# Patient Record
Sex: Female | Born: 1985 | Race: Black or African American | Hispanic: No | Marital: Married | Smoking: Never smoker
Health system: Southern US, Community
[De-identification: ages and names within clinical notes are randomized; demographics above are authoritative.]

## PROBLEM LIST (undated history)

## (undated) ENCOUNTER — Inpatient Hospital Stay (HOSPITAL_COMMUNITY): Payer: Self-pay

## (undated) DIAGNOSIS — O139 Gestational [pregnancy-induced] hypertension without significant proteinuria, unspecified trimester: Secondary | ICD-10-CM

## (undated) DIAGNOSIS — N979 Female infertility, unspecified: Secondary | ICD-10-CM

## (undated) DIAGNOSIS — R87629 Unspecified abnormal cytological findings in specimens from vagina: Secondary | ICD-10-CM

## (undated) DIAGNOSIS — J302 Other seasonal allergic rhinitis: Secondary | ICD-10-CM

## (undated) DIAGNOSIS — IMO0002 Reserved for concepts with insufficient information to code with codable children: Secondary | ICD-10-CM

## (undated) DIAGNOSIS — Z91030 Bee allergy status: Secondary | ICD-10-CM

## (undated) HISTORY — DX: Other seasonal allergic rhinitis: J30.2

## (undated) HISTORY — PX: NO PAST SURGERIES: SHX2092

## (undated) HISTORY — DX: Gestational (pregnancy-induced) hypertension without significant proteinuria, unspecified trimester: O13.9

## (undated) HISTORY — DX: Bee allergy status: Z91.030

## (undated) HISTORY — DX: Reserved for concepts with insufficient information to code with codable children: IMO0002

---

## 2003-11-21 DIAGNOSIS — IMO0002 Reserved for concepts with insufficient information to code with codable children: Secondary | ICD-10-CM

## 2003-11-21 DIAGNOSIS — R87619 Unspecified abnormal cytological findings in specimens from cervix uteri: Secondary | ICD-10-CM

## 2003-11-21 HISTORY — DX: Unspecified abnormal cytological findings in specimens from cervix uteri: R87.619

## 2003-11-21 HISTORY — DX: Reserved for concepts with insufficient information to code with codable children: IMO0002

## 2012-07-18 ENCOUNTER — Ambulatory Visit (INDEPENDENT_AMBULATORY_CARE_PROVIDER_SITE_OTHER): Payer: 59 | Admitting: Family Medicine

## 2012-07-18 ENCOUNTER — Other Ambulatory Visit (HOSPITAL_COMMUNITY)
Admission: RE | Admit: 2012-07-18 | Discharge: 2012-07-18 | Disposition: A | Discharge status: Home / Self Care | Payer: 59 | Source: Ambulatory Visit | Attending: Family Medicine | Admitting: Family Medicine

## 2012-07-18 ENCOUNTER — Encounter: Payer: Self-pay | Admitting: Family Medicine

## 2012-07-18 VITALS — BP 112/72 | HR 72 | Ht 67.0 in | Wt 171.0 lb

## 2012-07-18 DIAGNOSIS — R5383 Other fatigue: Secondary | ICD-10-CM

## 2012-07-18 DIAGNOSIS — T7840XA Allergy, unspecified, initial encounter: Secondary | ICD-10-CM

## 2012-07-18 DIAGNOSIS — R5381 Other malaise: Secondary | ICD-10-CM

## 2012-07-18 DIAGNOSIS — Z0000 Encounter for general adult medical examination without abnormal findings: Secondary | ICD-10-CM

## 2012-07-18 DIAGNOSIS — Z91030 Bee allergy status: Secondary | ICD-10-CM

## 2012-07-18 DIAGNOSIS — N760 Acute vaginitis: Secondary | ICD-10-CM | POA: Insufficient documentation

## 2012-07-18 DIAGNOSIS — Z113 Encounter for screening for infections with a predominantly sexual mode of transmission: Secondary | ICD-10-CM | POA: Insufficient documentation

## 2012-07-18 DIAGNOSIS — Z01419 Encounter for gynecological examination (general) (routine) without abnormal findings: Secondary | ICD-10-CM | POA: Insufficient documentation

## 2012-07-18 DIAGNOSIS — Z23 Encounter for immunization: Secondary | ICD-10-CM

## 2012-07-18 DIAGNOSIS — Z13220 Encounter for screening for lipoid disorders: Secondary | ICD-10-CM

## 2012-07-18 LAB — POCT URINALYSIS DIPSTICK
Blood, UA: NEGATIVE
Glucose, UA: NEGATIVE
Ketones, UA: NEGATIVE
Spec Grav, UA: 1.015

## 2012-07-18 MED ORDER — EPINEPHRINE 0.3 MG/0.3ML IJ DEVI: 0.3000 mg | Freq: Once | INTRAMUSCULAR | Status: DC

## 2012-07-18 NOTE — Patient Instructions (Addendum)
HEALTH MAINTENANCE RECOMMENDATIONS:  It is recommended that you get at least 30 minutes of aerobic exercise at least 5 days/week (for weight loss, you may need as much as 60-90 minutes). This can be any activity that gets your heart rate up. This can be divided in 10-15 minute intervals if needed, but try and build up your endurance at least once a week.  Weight bearing exercise is also recommended twice weekly.  Eat a healthy diet with lots of vegetables, fruits and fiber.  "Colorful" foods have a lot of vitamins (ie green vegetables, tomatoes, red peppers, etc).  Limit sweet tea, regular sodas and alcoholic beverages, all of which has a lot of calories and sugar.  Up to 1 alcoholic drink daily may be beneficial for women (unless trying to lose weight, watch sugars).  Drink a lot of water.  Calcium recommendations are 1200-1500 mg daily (1500 mg for postmenopausal women or women without ovaries), and vitamin D 1000 IU daily.  This should be obtained from diet and/or supplements (vitamins), and calcium should not be taken all at once, but in divided doses.  Monthly self breast exams and yearly mammograms for women over the age of 71 is recommended.  Sunscreen of at least SPF 30 should be used on all sun-exposed parts of the skin when outside between the hours of 10 am and 4 pm (not just when at beach or pool, but even with exercise, golf, tennis, and yard work!)  Use a sunscreen that says "broad spectrum" so it covers both UVA and UVB rays, and make sure to reapply every 1-2 hours.  Remember to change the batteries in your smoke detectors when changing your clock times in the spring and fall.  Use your seat belt every time you are in a car, and please drive safely and not be distracted with cell phones and texting while driving.  Please start taking prenatal vitamins.  Routine eye exam is recommended; dental visits twice yearly for cleanings.  Please get flu shot through your work [employer] given  today (tetanus and pertussis)

## 2012-07-18 NOTE — Progress Notes (Signed)
Chief Complaint  Patient presents with  . Annual Exam    new patient CPE non fasting. Would like to have pap done today. States she has been having a vaginal "fishy odor" x 2-3 months. Also states she is allergic to bee stings and needs an epi pen.   Health Maintenance: There is no immunization history on file for this patient. Can't recall last tetanus Last Pap smear: >3 years ago.  Had abnormal pap with last pregnancy, s/p cryotherapy postpartum Last mammogram: never Last colonoscopy: never Last DEXA:  never Dentist: once yearly Ophtho: 4-5 years ago, wears glasses Exercise: walks dogs 3x/week Never had cholesterol checked.  Past Medical History  Diagnosis Date  . 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 Seasonal allergies   . PIH (pregnancy induced hypertension)     resolved after pregnancy; didn't require medication  . Abnormal pap 2005    s/p cryotherapy    History reviewed. No pertinent past surgical history.  History   Social History  . Marital Status: Single    Spouse Name: N/A    Number of Children: 1  . Years of Education: N/A   Occupational History  . call center [employer]   Social History Main Topics  . Smoking status: Never Smoker   . Smokeless tobacco: Never Used  . Alcohol Use: Yes     rarely, social gatherings.  . Drug Use: No  . Sexually Active: Yes -- Female partner(s)    Birth Control/ Protection: None   Other Topics Concern  . Not on file   Social History Narrative   Lives with fiance, daughter, 2 dogs.    Family History  Problem Relation Age of Onset  . Gout Mother   . Cancer Maternal Grandmother     breast cancer  . Diabetes Paternal Grandmother   . Hypertension Paternal Grandmother   . Heart disease Neg Hx     No current outpatient prescriptions on file.  Allergies  Allergen Reactions  . Bee Venom Anaphylaxis    ROS:  The patient denies anorexia, fever, weight changes, headaches,  vision changes,  decreased hearing, ear pain, sore throat, breast concerns, chest pain, palpitations, dizziness, syncope, dyspnea on exertion, cough, swelling, nausea, vomiting, diarrhea, constipation, abdominal pain, melena, hematochezia, indigestion/heartburn, hematuria, incontinence, dysuria, irregular menstrual cycles, genital lesions, joint pains, numbness, tingling, weakness, tremor, suspicious skin lesions, depression, anxiety, abnormal bleeding/bruising, or enlarged lymph nodes. +notes worsening odor and amount of discharge after intercourse, creamy yellow.  H/o BV in past.  PHYSICAL EXAM: BP 112/72  Pulse 72  Ht 5\' 7"  (1.702 m)  Wt 171 lb (77.565 kg)  BMI 26.78 kg/m2  LMP 07/03/2012  General Appearance:    Alert, cooperative, no distress, appears stated age  Head:    Normocephalic, without obvious abnormality, atraumatic  Eyes:    PERRL, conjunctiva/corneas clear, EOM's intact, fundi    benign  Ears:    Normal TM's and external ear canals  Nose:   Nares normal, mucosa normal, no drainage or sinus   tenderness  Throat:   Lips, mucosa, and tongue normal; teeth and gums normal  Neck:   Supple, no lymphadenopathy;  thyroid:  no   enlargement/tenderness/nodules; no carotid   bruit or JVD  Back:    Spine nontender, no curvature, ROM normal, no CVA     tenderness  Lungs:     Clear to auscultation bilaterally without wheezes, rales or     ronchi; respirations  unlabored  Chest Wall:    No tenderness or deformity   Heart:    Regular rate and rhythm, S1 and S2 normal, no murmur, rub   or gallop  Breast Exam:    No tenderness, masses, or nipple discharge or inversion.      No axillary lymphadenopathy  Abdomen:     Soft, non-tender, nondistended, normoactive bowel sounds,    no masses, no hepatosplenomegaly  Genitalia:    Normal external genitalia without lesions.  BUS and vagina normal; cervix without lesions, or cervical motion tenderness. Small amount of creamy white discharge, no odor .  Uterus and  adnexa not enlarged, nontender, no masses.  Pap performed  Rectal:    Not performed due to age<40 and no related complaints  Extremities:   No clubbing, cyanosis or edema  Pulses:   2+ and symmetric all extremities  Skin:   Skin color, texture, turgor normal, no rashes or lesions  Lymph nodes:   Cervical, supraclavicular, and axillary nodes normal  Neurologic:   CNII-XII intact, normal strength, sensation and gait; reflexes 2+ and symmetric throughout          Psych:   Normal mood, affect, hygiene and grooming.      ASSESSMENT/PLAN: 1. Routine general medical examination at a health care facility  Visual acuity screening, POCT Urinalysis Dipstick, CBC with Differential, Lipid panel, Comprehensive metabolic panel, Vitamin D 25 hydroxy, TSH, Cytology - PAP  2. Screening for lipoid disorders  Lipid panel  3. Other malaise and fatigue  CBC with Differential, Comprehensive metabolic panel, Vitamin D 25 hydroxy, TSH  4. Need for Tdap vaccination  Tdap vaccine greater than or equal to 7yo IM  5. Yellow jacket sting allergy  EPINEPHrine (EPI-PEN) 0.3 mg/0.3 mL DEVI  6. Vaginitis  Cytology - PAP   BV testing with pap, as well as GC, chlamydia  Tdap given today.  Flu shot is recommended Start prenatal vitamins.  Discussed monthly self breast exams and yearly mammograms after the age of 35; at least 30 minutes of aerobic activity at least 5 days/week; proper sunscreen use reviewed; healthy diet, including goals of calcium and vitamin D intake and alcohol recommendations (less than or equal to 1 drink/day) reviewed; regular seatbelt use; changing batteries in smoke detectors.

## 2012-07-24 ENCOUNTER — Other Ambulatory Visit: Payer: Self-pay | Admitting: *Deleted

## 2012-07-25 MED ORDER — METRONIDAZOLE 500 MG PO TABS: ORAL_TABLET | ORAL | Status: DC

## 2012-07-25 NOTE — Addendum Note (Signed)
Addended by: Barbette Or A on: 07/25/2012 09:03 AM   Modules accepted: Orders

## 2012-07-26 ENCOUNTER — Other Ambulatory Visit: Payer: 59

## 2012-07-26 DIAGNOSIS — Z13220 Encounter for screening for lipoid disorders: Secondary | ICD-10-CM

## 2012-07-26 DIAGNOSIS — R5381 Other malaise: Secondary | ICD-10-CM

## 2012-07-26 DIAGNOSIS — Z0000 Encounter for general adult medical examination without abnormal findings: Secondary | ICD-10-CM

## 2012-07-26 LAB — COMPREHENSIVE METABOLIC PANEL
ALT: 11 U/L (ref 0–35)
CO2: 25 mEq/L (ref 19–32)
Calcium: 9.6 mg/dL (ref 8.4–10.5)
Chloride: 106 mEq/L (ref 96–112)
Creat: 0.95 mg/dL (ref 0.50–1.10)
Glucose, Bld: 93 mg/dL (ref 70–99)
Sodium: 138 mEq/L (ref 135–145)
Total Protein: 6.8 g/dL (ref 6.0–8.3)

## 2012-07-26 LAB — CBC WITH DIFFERENTIAL/PLATELET
Basophils Absolute: 0 10*3/uL (ref 0.0–0.1)
Basophils Relative: 1 % (ref 0–1)
HCT: 36.4 % (ref 36.0–46.0)
Lymphocytes Relative: 42 % (ref 12–46)
MCHC: 35.4 g/dL (ref 30.0–36.0)
Monocytes Absolute: 0.3 10*3/uL (ref 0.1–1.0)
Neutro Abs: 2.5 10*3/uL (ref 1.7–7.7)
Platelets: 369 10*3/uL (ref 150–400)
RDW: 12.9 % (ref 11.5–15.5)
WBC: 4.9 10*3/uL (ref 4.0–10.5)

## 2012-07-26 LAB — LIPID PANEL
LDL Cholesterol: 147 mg/dL — ABNORMAL HIGH (ref 0–99)
Triglycerides: 63 mg/dL (ref <150)

## 2012-07-26 LAB — TSH: TSH: 0.785 u[IU]/mL (ref 0.350–4.500)

## 2012-07-27 LAB — VITAMIN D 25 HYDROXY (VIT D DEFICIENCY, FRACTURES): Vit D, 25-Hydroxy: 19 ng/mL — ABNORMAL LOW (ref 30–89)

## 2012-07-29 ENCOUNTER — Other Ambulatory Visit: Payer: Self-pay | Admitting: *Deleted

## 2012-07-29 DIAGNOSIS — E559 Vitamin D deficiency, unspecified: Secondary | ICD-10-CM

## 2012-07-29 MED ORDER — ERGOCALCIFEROL 1.25 MG (50000 UT) PO CAPS: 50000.0000 [IU] | ORAL_CAPSULE | ORAL | Status: DC

## 2015-01-13 ENCOUNTER — Encounter: Payer: Self-pay | Admitting: Family Medicine

## 2015-01-13 ENCOUNTER — Other Ambulatory Visit (HOSPITAL_COMMUNITY)
Admission: RE | Admit: 2015-01-13 | Discharge: 2015-01-13 | Disposition: A | Discharge status: Home / Self Care | Payer: BLUE CROSS/BLUE SHIELD | Source: Ambulatory Visit | Attending: Family Medicine | Admitting: Family Medicine

## 2015-01-13 ENCOUNTER — Ambulatory Visit (INDEPENDENT_AMBULATORY_CARE_PROVIDER_SITE_OTHER): Payer: BLUE CROSS/BLUE SHIELD | Admitting: Family Medicine

## 2015-01-13 VITALS — BP 122/74 | HR 80 | Ht 67.0 in | Wt 156.0 lb

## 2015-01-13 DIAGNOSIS — Z113 Encounter for screening for infections with a predominantly sexual mode of transmission: Secondary | ICD-10-CM | POA: Insufficient documentation

## 2015-01-13 DIAGNOSIS — E7800 Pure hypercholesterolemia, unspecified: Secondary | ICD-10-CM | POA: Insufficient documentation

## 2015-01-13 DIAGNOSIS — N979 Female infertility, unspecified: Secondary | ICD-10-CM

## 2015-01-13 DIAGNOSIS — Z0000 Encounter for general adult medical examination without abnormal findings: Secondary | ICD-10-CM

## 2015-01-13 DIAGNOSIS — Z1151 Encounter for screening for human papillomavirus (HPV): Secondary | ICD-10-CM | POA: Diagnosis present

## 2015-01-13 DIAGNOSIS — Z01419 Encounter for gynecological examination (general) (routine) without abnormal findings: Secondary | ICD-10-CM | POA: Insufficient documentation

## 2015-01-13 DIAGNOSIS — G4700 Insomnia, unspecified: Secondary | ICD-10-CM

## 2015-01-13 DIAGNOSIS — E559 Vitamin D deficiency, unspecified: Secondary | ICD-10-CM

## 2015-01-13 NOTE — Patient Instructions (Signed)
HEALTH MAINTENANCE RECOMMENDATIONS:  It is recommended that you get at least 30 minutes of aerobic exercise at least 5 days/week (for weight loss, you may need as much as 60-90 minutes). This can be any activity that gets your heart rate up. This can be divided in 10-15 minute intervals if needed, but try and build up your endurance at least once a week.  Weight bearing exercise is also recommended twice weekly.  Eat a healthy diet with lots of vegetables, fruits and fiber.  "Colorful" foods have a lot of vitamins (ie green vegetables, tomatoes, red peppers, etc).  Limit sweet tea, regular sodas and alcoholic beverages, all of which has a lot of calories and sugar.  Up to 1 alcoholic drink daily may be beneficial for women (unless trying to lose weight, watch sugars).  Drink a lot of water.  Calcium recommendations are 1200-1500 mg daily (1500 mg for postmenopausal women or women without ovaries), and vitamin D 1000 IU daily.  This should be obtained from diet and/or supplements (vitamins), and calcium should not be taken all at once, but in divided doses.  Monthly self breast exams and yearly mammograms for women over the age of 70 is recommended.  Sunscreen of at least SPF 30 should be used on all sun-exposed parts of the skin when outside between the hours of 10 am and 4 pm (not just when at beach or pool, but even with exercise, golf, tennis, and yard work!)  Use a sunscreen that says "broad spectrum" so it covers both UVA and UVB rays, and make sure to reapply every 1-2 hours.  Remember to change the batteries in your smoke detectors when changing your clock times in the spring and fall.  Use your seat belt every time you are in a car, and please drive safely and not be distracted with cell phones and texting while driving.  Melatonin is an herbal supplement that might help you fall asleep and is safe to use nightly. You can also consider "PM" medications which usually contain diphenhydramine  (benadryl).   Insomnia Insomnia is frequent trouble falling and/or staying asleep. Insomnia can be a long term problem or a short term problem. Both are common. Insomnia can be a short term problem when the wakefulness is related to a certain stress or worry. Long term insomnia is often related to ongoing stress during waking hours and/or poor sleeping habits. Overtime, sleep deprivation itself can make the problem worse. Every little thing feels more severe because you are overtired and your ability to cope is decreased. CAUSES   Stress, anxiety, and depression.  Poor sleeping habits.  Distractions such as TV in the bedroom.  Naps close to bedtime.  Engaging in emotionally charged conversations before bed.  Technical reading before sleep.  Alcohol and other sedatives. They may make the problem worse. They can hurt normal sleep patterns and normal dream activity.  Stimulants such as caffeine for several hours prior to bedtime.  Pain syndromes and shortness of breath can cause insomnia.  Exercise late at night.  Changing time zones may cause sleeping problems (jet lag). It is sometimes helpful to have someone observe your sleeping patterns. They should look for periods of not breathing during the night (sleep apnea). They should also look to see how long those periods last. If you live alone or observers are uncertain, you can also be observed at a sleep clinic where your sleep patterns will be professionally monitored. Sleep apnea requires a checkup and treatment. Give  your caregivers your medical history. Give your caregivers observations your family has made about your sleep.  SYMPTOMS   Not feeling rested in the morning.  Anxiety and restlessness at bedtime.  Difficulty falling and staying asleep. TREATMENT   Your caregiver may prescribe treatment for an underlying medical disorders. Your caregiver can give advice or help if you are using alcohol or other drugs for  self-medication. Treatment of underlying problems will usually eliminate insomnia problems.  Medications can be prescribed for short time use. They are generally not recommended for lengthy use.  Over-the-counter sleep medicines are not recommended for lengthy use. They can be habit forming.  You can promote easier sleeping by making lifestyle changes such as:  Using relaxation techniques that help with breathing and reduce muscle tension.  Exercising earlier in the day.  Changing your diet and the time of your last meal. No night time snacks.  Establish a regular time to go to bed.  Counseling can help with stressful problems and worry.  Soothing music and white noise may be helpful if there are background noises you cannot remove.  Stop tedious detailed work at least one hour before bedtime. HOME CARE INSTRUCTIONS   Keep a diary. Inform your caregiver about your progress. This includes any medication side effects. See your caregiver regularly. Take note of:  Times when you are asleep.  Times when you are awake during the night.  The quality of your sleep.  How you feel the next day. This information will help your caregiver care for you.  Get out of bed if you are still awake after 15 minutes. Read or do some quiet activity. Keep the lights down. Wait until you feel sleepy and go back to bed.  Keep regular sleeping and waking hours. Avoid naps.  Exercise regularly.  Avoid distractions at bedtime. Distractions include watching television or engaging in any intense or detailed activity like attempting to balance the household checkbook.  Develop a bedtime ritual. Keep a familiar routine of bathing, brushing your teeth, climbing into bed at the same time each night, listening to soothing music. Routines increase the success of falling to sleep faster.  Use relaxation techniques. This can be using breathing and muscle tension release routines. It can also include visualizing  peaceful scenes. You can also help control troubling or intruding thoughts by keeping your mind occupied with boring or repetitive thoughts like the old concept of counting sheep. You can make it more creative like imagining planting one beautiful flower after another in your backyard garden.  During your day, work to eliminate stress. When this is not possible use some of the previous suggestions to help reduce the anxiety that accompanies stressful situations. MAKE SURE YOU:   Understand these instructions.  Will watch your condition.  Will get help right away if you are not doing well or get worse. Document Released: 11/03/2000 Document Revised: 01/29/2012 Document Reviewed: 12/04/2007 [facility] HospitalExit[facility] Patient Information 2015 [place], [place]. This information is not intended to replace advice given to you by your health care provider. Make sure you discuss any questions you have with your health care provider.

## 2015-01-13 NOTE — Progress Notes (Signed)
Chief Complaint  Patient presents with  . Annual Exam    nonfasting annual exam with pap. Did not do eye exam as she had exam last week with Dr.McFarland. Is having some sleep issues over the last year-had worsened over the last 6 months.    Jennifer Dawson is a 29 y.o. female who presents for a complete physical.  She has the following concerns:  Her husband switched to 3rd shift (working 7p - 7a 4x/week) about a year ago.  She thinks this triggered her having problems sleeping.  She likes to have him next to her when sleeping--she will take a nap with him after he comes home from work, and other times she will stay up with him, so they can spend time together.  She has trouble relaxing her brain in the evening to go to sleep, whether he is home or not (originally it was just when she was home alone).  She and her husband have been trying for pregnancy for over 2 years (maybe 3).  She has had 1 pregnancy, different father.  He has never fathered a child.  She has regular periods, and uses an app on her phone to track it.  She plans to schedule with fertility specialist, and have her husband and herself tested.  Lost 7 pounds in the last week and a half; has been taking OTC supplement (see under medication section) and working out twice daily.  Immunization History  Administered Date(s) Administered  . Tdap 07/18/2012  she did not get a flu shot this year and doesn't want one Last Pap smear: 06/2012; Had abnormal pap with her pregnancy, s/p cryotherapy postpartum Last mammogram: never Last colonoscopy: never Last DEXA: never Dentist: twice yearly Ophtho: last week Exercise: works out 6 days/week (high intensity workouts, weights 3x/week and cardio daily). walks dogs 3x/week Lipids checked at last physical: Lab Results  Component Value Date   CHOL 205* 07/26/2012   HDL 45 07/26/2012   LDLCALC 147* 07/26/2012   TRIG 63 07/26/2012   CHOLHDL 4.6 07/26/2012    Past Medical History   Diagnosis Date  . 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 Seasonal allergies   . PIH (pregnancy induced hypertension)     resolved after pregnancy; didn't require medication  . Abnormal pap 2005    s/p cryotherapy    History reviewed. No pertinent past surgical history.  History   Social History  . Marital Status: Single    Spouse Name: N/A  . Number of Children: 1  . Years of Education: N/A   Occupational History  . Not on file.   Social History Main Topics  . Smoking status: Never Smoker   . Smokeless tobacco: Never Used  . Alcohol Use: Yes     Comment: rarely, social gatherings.  . Drug Use: No  . Sexual Activity:    Partners: Male    Birth Control/ Protection: None   Other Topics Concern  . Not on file   Social History Narrative   Lives with husband, daughter, 2 dogs.   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      Family History  Problem Relation Age of Onset  . Gout Mother   . Cancer Maternal Grandmother     breast cancer  . Breast cancer Maternal Grandmother     possibly in her 49's  . Diabetes Paternal Grandmother   . Hypertension Paternal Grandmother   . Heart  disease Neg Hx   . Hypertension Father     Outpatient Encounter Prescriptions as of 01/13/2015  Medication Sig  . EPINEPHrine (EPI-PEN) 0.3 mg/0.3 mL DEVI Inject 0.3 mLs (0.3 mg total) into the muscle once. (Patient not taking: Reported on 01/13/2015)  . [DISCONTINUED] ergocalciferol (VITAMIN D2) 50000 UNITS capsule Take 1 capsule (50,000 Units total) by mouth once a week.  . [DISCONTINUED] metroNIDAZOLE (FLAGYL) 500 MG tablet Take 1 tablet BID for 7 days.  She stopped taking her multivitamin. She has been taking Shredz (a weight loss supplement)--taking fatburner, toner and detox.  Allergies  Allergen Reactions  . Bee Venom Anaphylaxis   ROS: The patient denies anorexia, fever, headaches, vision changes, decreased hearing, ear pain, sore  throat, breast concerns, chest pain, palpitations, dizziness, syncope, dyspnea on exertion, cough, swelling, nausea, vomiting, diarrhea, constipation, abdominal pain, melena, hematochezia, indigestion/heartburn, hematuria, incontinence, dysuria, irregular menstrual cycles, vaginal discharge, odor, itch, genital lesions, joint pains, numbness, tingling, weakness, tremor, suspicious skin lesions, depression, anxiety, abnormal bleeding/bruising, or enlarged lymph nodes. She lost 15 pounds since her last physical 06/2012. Breast itchiness recently.  PHYSICAL EXAM:  BP 122/74 mmHg  Pulse 80  Ht _0  (1.702 m)  Wt 156 lb (70.761 kg)  BMI 24.43 kg/m2  LMP 12/28/2014  General Appearance:   Alert, cooperative, no distress, appears stated age  Head:   Normocephalic, without obvious abnormality, atraumatic  Eyes:   PERRL, conjunctiva/corneas clear, EOM's intact, fundi   benign  Ears:   Normal TM's and external ear canals  Nose:  Nares normal, mucosa normal, no drainage or sinus tenderness  Throat:  Lips, mucosa, and tongue normal; teeth and gums normal  Neck:  Supple, no lymphadenopathy; thyroid: no enlargement/tenderness/nodules; no carotid  bruit or JVD  Back:  Spine nontender, no curvature, ROM normal, no CVA tenderness  Lungs:   Clear to auscultation bilaterally without wheezes, rales or ronchi; respirations unlabored  Chest Wall:   No tenderness or deformity  Heart:   Regular rate and rhythm, S1 and S2 normal, no murmur, rub  or gallop  Breast Exam:   No tenderness, masses, or nipple discharge or inversion. No axillary lymphadenopathy. Skin appears normal.  Abdomen:   Soft, non-tender, nondistended, normoactive bowel sounds,   no masses, no hepatosplenomegaly  Genitalia:   Normal external genitalia without lesions. BUS and vagina normal; cervix without lesions, or cervical motion tenderness. No abnormal vaginal discharge.  Uterus and adnexa not enlarged, nontender, no masses. Pap performed  Rectal:   Not performed due to age<40 and no related complaints  Extremities:  No clubbing, cyanosis or edema  Pulses:  2+ and symmetric all extremities  Skin:  Skin color, texture, turgor normal, no rashes or lesions  Lymph nodes:  Cervical, supraclavicular, and axillary nodes normal  Neurologic:  CNII-XII intact, normal strength, sensation and gait; reflexes 2+ and symmetric throughout   Psych: Normal mood, affect, hygiene and grooming.        ASSESSMENT/PLAN:  Annual physical exam - Plan: CBC with Differential/Platelet, Lipid panel, Vit D  25 hydroxy (rtn osteoporosis monitoring), TSH, Comprehensive metabolic panel, HIV antibody, Cytology - PAP [facility]  Vitamin D deficiency - noncompliant with supplementation recently - Plan: Vit D  25 hydroxy (rtn osteoporosis monitoring)  Pure hypercholesterolemia - return for fasting labs - Plan: Lipid panel  Insomnia - counseled re: sleep hygiene, OTC meds  Infertility, female - agree with eval.  discussed BBT and ovulation predictor kit. Husband needs testing. restart PNV   Counseled  re: sleep hygiene and OTC meds, relaxation techniques Stop naps. Restart PNV; ensure getting at least 1000 IU of Vit D (through PNV and/or supplement)  Discussed monthly self breast exams and yearly mammograms after the age of 35; at least 30 minutes of aerobic activity at least 5 days/week; proper sunscreen use reviewed; healthy diet, including goals of calcium and vitamin D intake and alcohol recommendations (less than or equal to 1 drink/day) reviewed; regular seatbelt use; changing batteries in smoke detectors   Return for fasting labs HIV, lipid, CBC, TSH, c-met, vit D  F/u 1 year, sooner prn

## 2015-01-15 ENCOUNTER — Encounter: Payer: Self-pay | Admitting: Family Medicine

## 2015-01-15 LAB — CYTOLOGY - PAP

## 2015-01-20 ENCOUNTER — Other Ambulatory Visit: Payer: Self-pay | Admitting: *Deleted

## 2015-01-20 ENCOUNTER — Other Ambulatory Visit: Payer: BLUE CROSS/BLUE SHIELD

## 2015-01-20 DIAGNOSIS — Z0000 Encounter for general adult medical examination without abnormal findings: Secondary | ICD-10-CM

## 2015-01-20 DIAGNOSIS — E7800 Pure hypercholesterolemia, unspecified: Secondary | ICD-10-CM

## 2015-01-20 DIAGNOSIS — E559 Vitamin D deficiency, unspecified: Secondary | ICD-10-CM

## 2015-01-20 DIAGNOSIS — Z91030 Bee allergy status: Secondary | ICD-10-CM

## 2015-01-20 LAB — CBC WITH DIFFERENTIAL/PLATELET
BASOS ABS: 0 10*3/uL (ref 0.0–0.1)
Basophils Relative: 0 % (ref 0–1)
Eosinophils Absolute: 0 10*3/uL (ref 0.0–0.7)
Eosinophils Relative: 1 % (ref 0–5)
HCT: 38 % (ref 36.0–46.0)
HEMOGLOBIN: 12.4 g/dL (ref 12.0–15.0)
LYMPHS ABS: 2.6 10*3/uL (ref 0.7–4.0)
Lymphocytes Relative: 56 % — ABNORMAL HIGH (ref 12–46)
MCH: 29.8 pg (ref 26.0–34.0)
MCHC: 32.6 g/dL (ref 30.0–36.0)
MCV: 91.3 fL (ref 78.0–100.0)
MPV: 9.3 fL (ref 8.6–12.4)
Monocytes Absolute: 0.3 10*3/uL (ref 0.1–1.0)
Monocytes Relative: 6 % (ref 3–12)
NEUTROS PCT: 37 % — AB (ref 43–77)
Neutro Abs: 1.7 10*3/uL (ref 1.7–7.7)
Platelets: 343 10*3/uL (ref 150–400)
RBC: 4.16 MIL/uL (ref 3.87–5.11)
RDW: 13.4 % (ref 11.5–15.5)
WBC: 4.6 10*3/uL (ref 4.0–10.5)

## 2015-01-20 MED ORDER — EPINEPHRINE 0.3 MG/0.3ML IJ SOAJ: 0.3000 mg | Freq: Once | INTRAMUSCULAR | Status: AC

## 2015-01-21 ENCOUNTER — Other Ambulatory Visit: Payer: Self-pay | Admitting: *Deleted

## 2015-01-21 LAB — COMPREHENSIVE METABOLIC PANEL
ALBUMIN: 3.9 g/dL (ref 3.5–5.2)
ALT: 18 U/L (ref 0–35)
AST: 19 U/L (ref 0–37)
Alkaline Phosphatase: 61 U/L (ref 39–117)
BUN: 11 mg/dL (ref 6–23)
CALCIUM: 9.3 mg/dL (ref 8.4–10.5)
CHLORIDE: 108 meq/L (ref 96–112)
CO2: 22 mEq/L (ref 19–32)
Creat: 0.93 mg/dL (ref 0.50–1.10)
GLUCOSE: 88 mg/dL (ref 70–99)
Potassium: 4.3 mEq/L (ref 3.5–5.3)
Sodium: 140 mEq/L (ref 135–145)
Total Bilirubin: 0.3 mg/dL (ref 0.2–1.2)
Total Protein: 6.5 g/dL (ref 6.0–8.3)

## 2015-01-21 LAB — LIPID PANEL
CHOLESTEROL: 182 mg/dL (ref 0–200)
HDL: 51 mg/dL (ref >=46)
LDL Cholesterol: 119 mg/dL — ABNORMAL HIGH (ref 0–99)
Total CHOL/HDL Ratio: 3.6 Ratio
Triglycerides: 58 mg/dL (ref <150)
VLDL: 12 mg/dL (ref 0–40)

## 2015-01-21 LAB — HIV ANTIBODY (ROUTINE TESTING W REFLEX): HIV: NONREACTIVE

## 2015-01-21 LAB — TSH: TSH: 1.897 u[IU]/mL (ref 0.350–4.500)

## 2015-01-21 LAB — VITAMIN D 25 HYDROXY (VIT D DEFICIENCY, FRACTURES): Vit D, 25-Hydroxy: 22 ng/mL — ABNORMAL LOW (ref 30–100)

## 2015-01-21 MED ORDER — ERGOCALCIFEROL 1.25 MG (50000 UT) PO CAPS: 50000.0000 [IU] | ORAL_CAPSULE | ORAL | Status: DC

## 2015-04-07 ENCOUNTER — Telehealth: Payer: Self-pay | Admitting: Internal Medicine

## 2015-04-07 NOTE — Telephone Encounter (Signed)
Faxed [facility] records to [facility] @ [phone] on 04/06/15

## 2016-01-17 ENCOUNTER — Ambulatory Visit (INDEPENDENT_AMBULATORY_CARE_PROVIDER_SITE_OTHER): Payer: BLUE CROSS/BLUE SHIELD | Admitting: Family Medicine

## 2016-01-17 ENCOUNTER — Encounter: Payer: Self-pay | Admitting: Family Medicine

## 2016-01-17 VITALS — BP 118/80 | HR 72 | Ht 67.25 in | Wt 166.0 lb

## 2016-01-17 DIAGNOSIS — Z0000 Encounter for general adult medical examination without abnormal findings: Secondary | ICD-10-CM

## 2016-01-17 DIAGNOSIS — E559 Vitamin D deficiency, unspecified: Secondary | ICD-10-CM | POA: Diagnosis not present

## 2016-01-17 DIAGNOSIS — Z23 Encounter for immunization: Secondary | ICD-10-CM | POA: Diagnosis not present

## 2016-01-17 DIAGNOSIS — E7800 Pure hypercholesterolemia, unspecified: Secondary | ICD-10-CM | POA: Diagnosis not present

## 2016-01-17 LAB — POCT URINALYSIS DIPSTICK
Bilirubin, UA: NEGATIVE
GLUCOSE UA: NEGATIVE
Ketones, UA: NEGATIVE
Leukocytes, UA: NEGATIVE
NITRITE UA: NEGATIVE
PH UA: 6
PROTEIN UA: NEGATIVE
RBC UA: NEGATIVE
UROBILINOGEN UA: NEGATIVE

## 2016-01-17 LAB — LIPID PANEL
CHOLESTEROL: 215 mg/dL — AB (ref 125–200)
HDL: 55 mg/dL (ref >=46)
LDL Cholesterol: 144 mg/dL — ABNORMAL HIGH (ref <130)
Total CHOL/HDL Ratio: 3.9 Ratio (ref <=5.0)
Triglycerides: 81 mg/dL (ref <150)
VLDL: 16 mg/dL (ref <30)

## 2016-01-17 NOTE — Progress Notes (Signed)
Chief Complaint  Patient presents with  . Annual Exam    fasting annual exam with pap. Started IVF treatments @ [facility]. Did not do eye exam, she is going to call and schedule one with Dr.McFarland.    Jennifer Dawson is a 30 y.o. female who presents for a complete physical.  She has the following concerns:  She and her husband have been trying for pregnancy for over 3 years. She has had 1 pregnancy, different father. He has never fathered a child. She has regular periods. They felt she wasn't ovulating, that his sperm counts were okay. She has been going to [facility] and started ovulation-inducing treatments (started in November).   Vitamin D deficiency:  Treated with 12 weeks of rx last year after it was low again at 22. She had been sporadic with vitamins for much of the year, but has been taking prenatal vitamins daily since November.  Intermittent insomnia.--melatonin helps, can fall asleep within an hour of taking.  Only needs it about 2x/week.  Hyperlipidemia:  Improved last year with dietary changes.  Eating more fruits, vegetables, protein (chicken, fish), a "clean" diet, since going to fertility specialist. Red meat 1x/mo, uses [place] sausage. Uses [employer] for butter, only a little cheese; uses egg whites.  Immunization History  Administered Date(s) Administered  . Tdap 07/18/2012   Last Pap smear: 12/2014, normal, no high risk HPV: Had abnormal pap with her pregnancy, s/p cryotherapy postpartum (2005) Last mammogram: never Last colonoscopy: never Last DEXA: never Dentist: twice yearly Ophtho: yearly Exercise: works out 5 days/week (medium intensity workouts, weights 3x/week and cardio daily). walks dogs 3x/week Lipids: Lab Results  Component Value Date   CHOL 182 01/20/2015   HDL 51 01/20/2015   LDLCALC 119* 01/20/2015   TRIG 58 01/20/2015   CHOLHDL 3.6 01/20/2015  Lipids were improved compared to prior (07/2012 LDL was  147)  01/2015--normal CBC, chem and TSH, HIV negative 01/2015: Vitamin D-OH 22  Past Medical History  Diagnosis Date  . 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 Seasonal allergies   . PIH (pregnancy induced hypertension)     resolved after pregnancy; didn't require medication  . Abnormal pap 2005    s/p cryotherapy    History reviewed. No pertinent past surgical history.  Social History   Social History  . Marital Status: Married    Spouse Name: N/A  . Number of Children: 1  . Years of Education: N/A   Occupational History  . Not on file.   Social History Main Topics  . Smoking status: Never Smoker   . Smokeless tobacco: Never Used  . Alcohol Use: Yes     Comment: rarely, social gatherings.  . Drug Use: No  . Sexual Activity:    Partners: Male    Birth Control/ Protection: None   Other Topics Concern  . Not on file   Social History Narrative   Lives with husband, daughter, 2 dogs.   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      Family History  Problem Relation Age of Onset  . Gout Mother   . Cancer Maternal Grandmother     breast cancer  . Breast cancer Maternal Grandmother     possibly in her 3's  . Diabetes Paternal Grandmother   . Hypertension Paternal Grandmother   . Heart disease Neg Hx   . Hypertension Father     Outpatient Encounter  Prescriptions as of 01/17/2016  Medication Sig Note  . letrozole (FEMARA) 2.5 MG tablet TAKE 3 TABLETS EVERY DAY CYCLE DAYS 3-7 01/17/2016: Received from: External Pharmacy  . OVIDREL 250 MCG/0.5ML injection INJECT AS DIRECTED BY DOCTOR 01/17/2016: Prn (has taken once, in January)  . Prenatal Vit-Fe Fumarate-FA (MULTIVITAMIN-PRENATAL) 27-0.8 MG TABS tablet Take 1 tablet by mouth daily at 12 noon. 01/17/2016: Taking since November  . EPINEPHrine 0.3 mg/0.3 mL IJ SOAJ injection Inject 0.3 mLs (0.3 mg total) into the muscle once. (Patient not taking: Reported on 01/17/2016)   .  MELATONIN PO Take 1 tablet by mouth at bedtime as needed (insomnia). Reported on 01/17/2016 01/17/2016: Uses prn,, about 2x/wk  . [DISCONTINUED] ergocalciferol (VITAMIN D2) 50000 UNITS capsule Take 1 capsule (50,000 Units total) by mouth once a week.    No facility-administered encounter medications on file as of 01/17/2016.   Started taking zyrtec last week for Spring allergies.   Allergies  Allergen Reactions  . Bee Venom Anaphylaxis    Yellow jackets, per pt    ROS: The patient denies anorexia, fever, headaches, vision changes, decreased hearing, ear pain, sore throat, breast concerns, chest pain, palpitations, dizziness, syncope, dyspnea on exertion, cough, swelling, nausea, vomiting, diarrhea, abdominal pain, melena, hematochezia, indigestion/heartburn, hematuria, incontinence, dysuria, irregular menstrual cycles, vaginal discharge, odor, itch, genital lesions, joint pains, numbness, tingling, weakness, tremor, suspicious skin lesions, depression, anxiety, abnormal bleeding/bruising, or enlarged lymph nodes. Some intermittent insomnia. +10# weight gain in the last year (she had lost 15# the year prior) Some constipation in the last few months (every few days she has a very large amount, hard at first, going to the bathroom multiple times). Mild allergies recently started, controlled with zyrtec.  PHYSICAL EXAM:  BP 118/80 mmHg  Pulse 72  Ht 5' 7.25" (1.708 m)  Wt 166 lb (75.297 kg)  BMI 25.81 kg/m2  LMP 01/07/2016  General Appearance:   Alert, cooperative, no distress, appears stated age  Head:   Normocephalic, without obvious abnormality, atraumatic  Eyes:   PERRL, conjunctiva/corneas clear, EOM's intact, fundi   benign  Ears:   Normal TM's and external ear canals  Nose:  Nares normal, mucosa is mildly edematous, no purulent drainage or sinus tenderness  Throat:  Lips, mucosa, and tongue normal; teeth and gums normal  Neck:  Supple, no  lymphadenopathy; thyroid: no enlargement/tenderness/nodules; no carotid  bruit or JVD  Back:  Spine nontender, no curvature, ROM normal, no CVA tenderness  Lungs:   Clear to auscultation bilaterally without wheezes, rales or ronchi; respirations unlabored  Chest Wall:   No tenderness or deformity  Heart:   Regular rate and rhythm, S1 and S2 normal, no murmur, rub  or gallop  Breast Exam:   No tenderness, masses, or nipple discharge or inversion. No axillary lymphadenopathy. Skin appears normal.  Abdomen:   Soft, non-tender, nondistended, normoactive bowel sounds,   no masses, no hepatosplenomegaly  Genitalia:   Normal external genitalia without lesions. BUS and vagina normal;no cervical motion tenderness. No abnormal vaginal discharge. Uterus and adnexa not enlarged, nontender, no masses. Pap not performed  Rectal:   Not performed due to age<40 and no related complaints  Extremities:  No clubbing, cyanosis or edema  Pulses:  2+ and symmetric all extremities  Skin:  Skin color, texture, turgor normal, no rashes or lesions. Hyperpigmented lesion right lower extremity consistent with dermatofibroma (unchanged per pt)  Lymph nodes:  Cervical, supraclavicular, and axillary nodes normal  Neurologic:  CNII-XII intact, normal strength, sensation  and gait; reflexes 2+ and symmetric throughout   Psych: Normal mood, affect, hygiene and grooming              ASSESSMENT/PLAN:  Annual physical exam - Plan: POCT Urinalysis Dipstick  Vitamin D deficiency - Plan: VITAMIN D 25 Hydroxy (Vit-D Deficiency, Fractures)  Pure hypercholesterolemia - Plan: Lipid panel  Need for prophylactic vaccination and inoculation against influenza - Plan: Flu Vaccine QUAD 36+ mos PF IM (Fluarix & Fluzone Quad PF)   Vit D and lipids today  Add colace 1-2 tablets daily--this is a stool softener that  might help counteract the constipating effect of iron from the multivitamin.  You can talk with the specialists (or OB-GYN at some point) about changing to a prescription vitamin that contains colace.  Discussed monthly self breast exams and yearly mammograms after the age of 34; baseline at 17 due to family history at young age; at least 30 minutes of aerobic activity at least 5 days/week, weight-bearing exercise at least 2d/wk; proper sunscreen use reviewed; healthy diet, including goals of calcium and vitamin D intake and alcohol recommendations (less than or equal to 1 drink/day) reviewed; regular seatbelt use; changing batteries in smoke detectors  Flu shot today, even though late in season--still some benefit, and she is actively trying for pregnancy.   F/u 1 year, sooner prn

## 2016-01-17 NOTE — Patient Instructions (Addendum)
  HEALTH MAINTENANCE RECOMMENDATIONS:  It is recommended that you get at least 30 minutes of aerobic exercise at least 5 days/week (for weight loss, you may need as much as 60-90 minutes). This can be any activity that gets your heart rate up. This can be divided in 10-15 minute intervals if needed, but try and build up your endurance at least once a week.  Weight bearing exercise is also recommended twice weekly.  Eat a healthy diet with lots of vegetables, fruits and fiber.  "Colorful" foods have a lot of vitamins (ie green vegetables, tomatoes, red peppers, etc).  Limit sweet tea, regular sodas and alcoholic beverages, all of which has a lot of calories and sugar.  Up to 1 alcoholic drink daily may be beneficial for women (unless trying to lose weight, watch sugars).  Drink a lot of water.  Calcium recommendations are 1200-1500 mg daily (1500 mg for postmenopausal women or women without ovaries), and vitamin D 1000 IU daily.  This should be obtained from diet and/or supplements (vitamins), and calcium should not be taken all at once, but in divided doses.  Monthly self breast exams and yearly mammograms for women over the age of 49 is recommended. I recommend a screening mammogram at 35 for you, since your grandmother was so young when she had breast cancer.  Sunscreen of at least SPF 30 should be used on all sun-exposed parts of the skin when outside between the hours of 10 am and 4 pm (not just when at beach or pool, but even with exercise, golf, tennis, and yard work!)  Use a sunscreen that says "broad spectrum" so it covers both UVA and UVB rays, and make sure to reapply every 1-2 hours.  Remember to change the batteries in your smoke detectors when changing your clock times in the spring and fall.  Use your seat belt every time you are in a car, and please drive safely and not be distracted with cell phones and texting while driving.     Add colace 1-2 tablets daily--this is a stool  softener that might help counteract the constipating effect of iron from the multivitamin.  You can talk with the specialists (or OB-GYN at some point) about changing to a prescription vitamin that contains colace.

## 2016-01-18 LAB — VITAMIN D 25 HYDROXY (VIT D DEFICIENCY, FRACTURES): VIT D 25 HYDROXY: 18 ng/mL — AB (ref 30–100)

## 2016-01-19 ENCOUNTER — Other Ambulatory Visit: Payer: Self-pay | Admitting: *Deleted

## 2016-01-19 MED ORDER — ERGOCALCIFEROL 1.25 MG (50000 UT) PO CAPS: 50000.0000 [IU] | ORAL_CAPSULE | ORAL | Status: DC

## 2016-04-25 ENCOUNTER — Other Ambulatory Visit: Payer: Self-pay | Admitting: Family Medicine

## 2016-04-25 NOTE — Telephone Encounter (Signed)
Is this okay to refill? 

## 2016-04-25 NOTE — Telephone Encounter (Signed)
No.  This is NOT supposed to be refilled, 12 week course only.  Patient should be advised to start taking 2000 IU of OTC Vitamin D upon completing the 12 weeks, doesn't need refill.

## 2016-04-25 NOTE — Telephone Encounter (Signed)
Pt is already taking 2000 units OTC now and rx must have been on auto refill. Denying rx

## 2016-09-15 ENCOUNTER — Other Ambulatory Visit (INDEPENDENT_AMBULATORY_CARE_PROVIDER_SITE_OTHER): Payer: BLUE CROSS/BLUE SHIELD

## 2016-09-15 DIAGNOSIS — Z23 Encounter for immunization: Secondary | ICD-10-CM

## 2016-11-20 NOTE — L&D Delivery Note (Signed)
Delivery Note At 9:55 AM a viable female was delivered via Vaginal, Spontaneous Delivery (Presentation: occiput anteiror  ;  ).  APGAR: 8, 9; weight 6 lb 4.5 oz (2850 g).   Placenta status: intact , 3 vessel  Cord:  with the following complications: None.  Cord pH: NA  Anesthesia:   Episiotomy: None Lacerations: Periurethral Suture Repair: 3.0 vicryl Est. Blood Loss (mL): 100  Mom to postpartum.  Baby to Couplet care / Skin to Skin.  Timmy Bubeck J. 05/04/2017, 11:26 AM

## 2016-11-30 ENCOUNTER — Inpatient Hospital Stay (HOSPITAL_COMMUNITY)
Admission: AD | Admit: 2016-11-30 | Discharge: 2016-11-30 | Disposition: A | Discharge status: Home / Self Care | Payer: 59 | Source: Ambulatory Visit | Attending: Obstetrics and Gynecology | Admitting: Obstetrics and Gynecology

## 2016-11-30 ENCOUNTER — Encounter (HOSPITAL_COMMUNITY): Payer: Self-pay | Admitting: *Deleted

## 2016-11-30 DIAGNOSIS — Z3A16 16 weeks gestation of pregnancy: Secondary | ICD-10-CM | POA: Diagnosis not present

## 2016-11-30 DIAGNOSIS — O2342 Unspecified infection of urinary tract in pregnancy, second trimester: Secondary | ICD-10-CM

## 2016-11-30 DIAGNOSIS — O26892 Other specified pregnancy related conditions, second trimester: Secondary | ICD-10-CM | POA: Diagnosis not present

## 2016-11-30 DIAGNOSIS — N390 Urinary tract infection, site not specified: Secondary | ICD-10-CM

## 2016-11-30 DIAGNOSIS — R102 Pelvic and perineal pain: Secondary | ICD-10-CM

## 2016-11-30 DIAGNOSIS — R300 Dysuria: Secondary | ICD-10-CM | POA: Diagnosis present

## 2016-11-30 HISTORY — DX: Gestational (pregnancy-induced) hypertension without significant proteinuria, unspecified trimester: O13.9

## 2016-11-30 HISTORY — DX: Female infertility, unspecified: N97.9

## 2016-11-30 HISTORY — DX: Unspecified abnormal cytological findings in specimens from vagina: R87.629

## 2016-11-30 LAB — URINALYSIS, ROUTINE W REFLEX MICROSCOPIC
Bilirubin Urine: NEGATIVE
Glucose, UA: NEGATIVE mg/dL
HGB URINE DIPSTICK: NEGATIVE
Ketones, ur: 5 mg/dL — AB
LEUKOCYTES UA: NEGATIVE
Nitrite: NEGATIVE
Protein, ur: NEGATIVE mg/dL
SPECIFIC GRAVITY, URINE: 1.009 (ref 1.005–1.030)
pH: 6 (ref 5.0–8.0)

## 2016-11-30 LAB — WET PREP, GENITAL
CLUE CELLS WET PREP: NONE SEEN
SPERM: NONE SEEN
TRICH WET PREP: NONE SEEN
YEAST WET PREP: NONE SEEN

## 2016-11-30 MED ORDER — CEPHALEXIN 500 MG PO CAPS: 500.0000 mg | ORAL_CAPSULE | Freq: Four times a day (QID) | ORAL | 0 refills | Status: AC

## 2016-11-30 NOTE — MAU Note (Signed)
Last 2 days has been having lower abd pain and pressure when she urinates.  When she walks she feels a lot of pressure. Frequency and urgency. Denies back pain or fever

## 2016-11-30 NOTE — MAU Provider Note (Signed)
History     CSN: [id number]  Arrival date and time: 11/30/16 1334   First Provider Initiated Contact with Patient 11/30/16 1441      Chief Complaint  Patient presents with  . Dysuria   G2P1001 @16  weeks here with pelvic pain and pressure. She reports pain started 2 days ago, intermittent and occurs only during and after voids, and is located just over bladder region. She denies hematuria and endorses dysuria, urgency and frequency. She denies fever and chills. She has not used analgesics. She denies VB or LOF. She reports increasing thin, white, non-odorous vaginal discharge. Pregnancy has been uncomplicated to date.    Dysuria   Pertinent negatives include no chills.    OB History    Gravida Para Term Preterm AB Living   2 1 1     1    SAB TAB Ectopic Multiple Live Births           1      Obstetric Comments   BP elevation, started at 7 months      Past Medical History:  Diagnosis Date  . 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 Pregnancy induced hypertension    first preg at 7 months  . Vaginal Pap smear, abnormal    with first preg, normal after    Past Surgical History:  Procedure Laterality Date  . NO PAST SURGERIES      Family History  Problem Relation Age of Onset  . Hypertension Father   . Cancer Maternal Grandmother     breast  . Diabetes Paternal Grandmother   . Hypertension Paternal Grandmother     Social History  Substance Use Topics  . Smoking status: Never Smoker  . Smokeless tobacco: Never Used  . Alcohol use No    Allergies: No Known Allergies  Prescriptions Prior to Admission  Medication Sig Dispense Refill Last Dose  . Prenatal Vit-Fe Fumarate-FA (PRENATAL MULTIVITAMIN) TABS tablet Take 1 tablet by mouth daily at 12 noon.   11/29/2016 at Unknown time    Review of Systems  Constitutional: Negative for chills and fever.  Gastrointestinal: Positive for constipation (BM yesterday, hard stool).   Genitourinary: Positive for dysuria, pelvic pain and vaginal discharge.   Physical Exam   Blood pressure 121/67, pulse 75, temperature 98.4 F (36.9 C), temperature source Oral, resp. rate 18, weight 81.3 kg (179 lb 4 oz).  Physical Exam  Nursing note and vitals reviewed. Constitutional: She is oriented to person, place, and time. She appears well-developed and well-nourished.  HENT:  Head: Normocephalic.  Neck: Normal range of motion.  Cardiovascular: Normal rate.   Respiratory: Effort normal.  GI: Soft. She exhibits no distension. There is tenderness in the suprapubic area. There is no rebound and no guarding.  gravid  Genitourinary:  Genitourinary Comments: External: no lesions or erythema Vagina: rugated, parous scant, thin, white discharge SVE: closed/thick   Musculoskeletal: Normal range of motion.  Neurological: She is alert and oriented to person, place, and time.  Skin: Skin is warm and dry.  Psychiatric: She has a normal mood and affect.   Results for orders placed or performed during the hospital encounter of 11/30/16 (from the past 24 hour(s))  Urinalysis, Routine w reflex microscopic     Status: Abnormal   Collection Time: 11/30/16  2:04 PM  Result Value Ref Range   Color, Urine STRAW (A) YELLOW   APPearance CLEAR CLEAR   Specific Gravity, Urine 1.009  1.005 - 1.030   pH 6.0 5.0 - 8.0   Glucose, UA NEGATIVE NEGATIVE mg/dL   Hgb urine dipstick NEGATIVE NEGATIVE   Bilirubin Urine NEGATIVE NEGATIVE   Ketones, ur 5 (A) NEGATIVE mg/dL   Protein, ur NEGATIVE NEGATIVE mg/dL   Nitrite NEGATIVE NEGATIVE   Leukocytes, UA NEGATIVE NEGATIVE  Wet prep, genital     Status: Abnormal   Collection Time: 11/30/16  2:50 PM  Result Value Ref Range   Yeast Wet Prep HPF POC NONE SEEN NONE SEEN   Trich, Wet Prep NONE SEEN NONE SEEN   Clue Cells Wet Prep HPF POC NONE SEEN NONE SEEN   WBC, Wet Prep HPF POC MODERATE (A) NONE SEEN   Sperm NONE SEEN    MAU Course   Procedures  MDM Labs ordered and reviewed. No evidence of pelvic infection or threatened abortion. UA essentially nml but will treat for UTI based on presentation and exam. Presentation, clinical findings, and plan discussed with Dr. Richardson Dopp. Stable for discharge home.  Assessment and Plan   1. [redacted] weeks gestation of pregnancy   2. Complicated UTI (urinary tract infection)   3. Pelvic pain affecting pregnancy in second trimester, antepartum    Discharge home Increase water intake, add cranberry juice Follow up with Dr. Richardson Dopp as scheduled in 2 weeks Notify MD for persistent or worsening sx  Allergies as of 11/30/2016   No Known Allergies     Medication List    TAKE these medications   cephALEXin 500 MG capsule Commonly known as:  KEFLEX Take 1 capsule (500 mg total) by mouth 4 (four) times daily.   prenatal multivitamin Tabs tablet Take 1 tablet by mouth daily at 12 noon.      Donette Larry, CNM 11/30/2016, 2:52 PM

## 2016-11-30 NOTE — Discharge Instructions (Signed)
Pregnancy and Urinary Tract Infection °WHAT IS A URINARY TRACT INFECTION? °A urinary tract infection (UTI) is an infection of any part of the urinary tract. This includes the kidneys, the tubes that connect your kidneys to your bladder (ureters), the bladder, and the tube that carries urine out of your body (urethra). These organs make, store, and get rid of urine in the body. A UTI can be a bladder infection (cystitis) or a kidney infection (pyelonephritis). This infection may be caused by fungi, viruses, and bacteria. Bacteria are the most common cause of UTIs. °You are more likely to develop a UTI during pregnancy because: °· The physical and hormonal changes your body goes through can make it easier for bacteria to get into your urinary tract. °· Your growing baby puts pressure on your uterus and can affect urine flow. °DOES A UTI PLACE MY BABY AT RISK? °An untreated UTI during pregnancy could lead to a kidney infection, which can cause health problems that could affect your baby. Possible complications of an untreated UTI include: °· Having your baby before 37 weeks of pregnancy (premature). °· Having a baby with a low birth weight. °· Developing high blood pressure during pregnancy (preeclampsia). °WHAT ARE THE SYMPTOMS OF A UTI? °Symptoms of a UTI include: °· Fever. °· Frequent urination or passing small amounts of urine frequently. °· Needing to urinate urgently. °· Pain or a burning sensation with urination. °· Urine that smells bad or unusual. °· Cloudy urine. °· Pain in the lower abdomen or back. °· Trouble urinating. °· Blood in the urine. °· Vomiting or being less hungry than normal. °· Diarrhea or abdominal pain. °· Vaginal discharge. °WHAT ARE THE TREATMENT OPTIONS FOR A UTI DURING PREGNANCY? °Treatment for this condition may include: °· Antibiotic medicines that are safe to take during pregnancy. °· Other medicines to treat less common causes of UTI. °HOW CAN I PREVENT A UTI? °To prevent a UTI: °· Go  to the bathroom as soon as you feel the need. °· Always wipe from front to back. °· Wash your genital area with soap and warm water daily. °· Empty your bladder before and after sex. °· Wear cotton underwear. °· Limit your intake of high sugar foods or drinks, such as regular soda, juice, and sweets.. °· Drink 6-8 glasses of water daily. °· Do not wear tight-fitting pants. °· Do not douche or use deodorant sprays. °· Do not drink alcohol, caffeine, or carbonated drinks. These can irritate the bladder. °WHEN SHOULD I SEEK MEDICAL CARE? °Seek medical care if: °· Your symptoms do not improve or get worse. °· You have a fever after two days of treatment. °· You have a rash. °· You have abnormal vaginal discharge. °· You have back or side pain. °· You have chills. °· You have nausea and vomiting. °WHEN SHOULD I SEEK IMMEDIATE MEDICAL CARE? °Seek immediate medical care if you are pregnant and: °· You feel contractions in your uterus. °· You have lower belly pain. °· You have a gush of fluid from your vagina. °· You have blood in your urine. °· You are vomiting and cannot keep down any medicines or water. °This information is not intended to replace advice given to you by your health care provider. Make sure you discuss any questions you have with your health care provider. °Document Released: 03/03/2011 Document Revised: 04/10/2016 Document Reviewed: 09/27/2015 °Elsevier Interactive Patient Education © 2017 [employer] ° °

## 2016-12-01 ENCOUNTER — Encounter (HOSPITAL_COMMUNITY): Payer: Self-pay

## 2016-12-01 ENCOUNTER — Encounter: Payer: Self-pay | Admitting: Family Medicine

## 2016-12-01 LAB — GC/CHLAMYDIA PROBE AMP (~~LOC~~) NOT AT ARMC
CHLAMYDIA, DNA PROBE: NEGATIVE
NEISSERIA GONORRHEA: NEGATIVE

## 2016-12-01 LAB — CULTURE, OB URINE: Culture: NO GROWTH

## 2016-12-20 ENCOUNTER — Other Ambulatory Visit (HOSPITAL_COMMUNITY): Payer: Self-pay | Admitting: Obstetrics and Gynecology

## 2016-12-20 DIAGNOSIS — Z3689 Encounter for other specified antenatal screening: Secondary | ICD-10-CM

## 2016-12-20 DIAGNOSIS — Z3A20 20 weeks gestation of pregnancy: Secondary | ICD-10-CM

## 2016-12-28 ENCOUNTER — Ambulatory Visit (HOSPITAL_COMMUNITY)
Admission: RE | Admit: 2016-12-28 | Discharge: 2016-12-28 | Disposition: A | Discharge status: Home / Self Care | Payer: 59 | Source: Ambulatory Visit | Attending: Obstetrics and Gynecology | Admitting: Obstetrics and Gynecology

## 2016-12-28 DIAGNOSIS — O09292 Supervision of pregnancy with other poor reproductive or obstetric history, second trimester: Secondary | ICD-10-CM | POA: Diagnosis not present

## 2016-12-28 DIAGNOSIS — Z3689 Encounter for other specified antenatal screening: Secondary | ICD-10-CM | POA: Insufficient documentation

## 2016-12-28 DIAGNOSIS — Z3A20 20 weeks gestation of pregnancy: Secondary | ICD-10-CM | POA: Insufficient documentation

## 2016-12-28 DIAGNOSIS — Z363 Encounter for antenatal screening for malformations: Secondary | ICD-10-CM | POA: Insufficient documentation

## 2017-01-17 ENCOUNTER — Encounter: Payer: BLUE CROSS/BLUE SHIELD | Admitting: Family Medicine

## 2017-05-01 ENCOUNTER — Inpatient Hospital Stay (HOSPITAL_COMMUNITY)
Admission: AD | Admit: 2017-05-01 | Discharge: 2017-05-01 | Disposition: A | Discharge status: Home / Self Care | Payer: 59 | Source: Ambulatory Visit | Attending: Obstetrics and Gynecology | Admitting: Obstetrics and Gynecology

## 2017-05-01 ENCOUNTER — Encounter (HOSPITAL_COMMUNITY): Payer: Self-pay

## 2017-05-01 DIAGNOSIS — O479 False labor, unspecified: Secondary | ICD-10-CM

## 2017-05-01 NOTE — MAU Note (Signed)
Pt has been having ctx since 1130, no bleeding or LOF

## 2017-05-01 NOTE — MAU Note (Signed)
I have communicated with Dr Dion BodyVarnado and reviewed vital signs:  Vitals:   05/01/17 1235 05/01/17 1347  BP: 136/76 129/74  Pulse: 65 76  Resp:  16  Temp: 98.3 F (36.8 C)     Vaginal exam:  Dilation: 1 Effacement (%): 60 Cervical Position: Posterior Station: -2 Presentation: Vertex Exam by:: Wynelle BourgeoisMarie Williams CNM,   Also reviewed contraction pattern and that non-stress test is reactive.  It has been documented that patient is contracting every 5+ minutes with which have now spaced out.  Patient denies any other complaints.  Based on this report provider has given order for discharge.  A discharge order and diagnosis entered by a provider.   Labor discharge instructions reviewed with patient.

## 2017-05-01 NOTE — MAU Note (Signed)
Paged and called cell phone of Dr Richardson Doppole, no answer. Office called and got a recording that they are closed. Have now paged Dr. Dion BodyVarnado. Waiting for return call.

## 2017-05-03 ENCOUNTER — Inpatient Hospital Stay (HOSPITAL_COMMUNITY)
Admission: AD | Admit: 2017-05-03 | Discharge: 2017-05-06 | DRG: 775 | Disposition: A | Discharge status: Home / Self Care | Payer: 59 | Source: Ambulatory Visit | Attending: Obstetrics and Gynecology | Admitting: Obstetrics and Gynecology

## 2017-05-03 ENCOUNTER — Encounter (HOSPITAL_COMMUNITY): Payer: Self-pay | Admitting: *Deleted

## 2017-05-03 ENCOUNTER — Other Ambulatory Visit: Payer: Self-pay | Admitting: Obstetrics and Gynecology

## 2017-05-03 DIAGNOSIS — F419 Anxiety disorder, unspecified: Secondary | ICD-10-CM | POA: Diagnosis present

## 2017-05-03 DIAGNOSIS — O99344 Other mental disorders complicating childbirth: Secondary | ICD-10-CM | POA: Diagnosis present

## 2017-05-03 DIAGNOSIS — O134 Gestational [pregnancy-induced] hypertension without significant proteinuria, complicating childbirth: Secondary | ICD-10-CM | POA: Diagnosis present

## 2017-05-03 DIAGNOSIS — Z3A38 38 weeks gestation of pregnancy: Secondary | ICD-10-CM

## 2017-05-03 DIAGNOSIS — O139 Gestational [pregnancy-induced] hypertension without significant proteinuria, unspecified trimester: Secondary | ICD-10-CM | POA: Diagnosis present

## 2017-05-03 LAB — COMPREHENSIVE METABOLIC PANEL
ALBUMIN: 2.6 g/dL — AB (ref 3.5–5.0)
ALK PHOS: 156 U/L — AB (ref 38–126)
ALT: 18 U/L (ref 14–54)
AST: 26 U/L (ref 15–41)
Anion gap: 6 (ref 5–15)
BILIRUBIN TOTAL: 0.3 mg/dL (ref 0.3–1.2)
BUN: 7 mg/dL (ref 6–20)
CO2: 23 mmol/L (ref 22–32)
CREATININE: 0.82 mg/dL (ref 0.44–1.00)
Calcium: 9 mg/dL (ref 8.9–10.3)
Chloride: 108 mmol/L (ref 101–111)
GFR calc Af Amer: >60 mL/min (ref >60)
GLUCOSE: 94 mg/dL (ref 65–99)
POTASSIUM: 3.9 mmol/L (ref 3.5–5.1)
Sodium: 137 mmol/L (ref 135–145)
TOTAL PROTEIN: 6.4 g/dL — AB (ref 6.5–8.1)

## 2017-05-03 LAB — ABO/RH: ABO/RH(D): O POS

## 2017-05-03 LAB — OB RESULTS CONSOLE ABO/RH: RH Type: POSITIVE

## 2017-05-03 LAB — TYPE AND SCREEN
ABO/RH(D): O POS
Antibody Screen: NEGATIVE

## 2017-05-03 LAB — OB RESULTS CONSOLE GBS: STREP GROUP B AG: NEGATIVE

## 2017-05-03 LAB — OB RESULTS CONSOLE ANTIBODY SCREEN: Antibody Screen: NEGATIVE

## 2017-05-03 LAB — OB RESULTS CONSOLE RUBELLA ANTIBODY, IGM: Rubella: IMMUNE

## 2017-05-03 LAB — PROTEIN / CREATININE RATIO, URINE
CREATININE, URINE: 68 mg/dL
Protein Creatinine Ratio: 0.1 mg/mg{Cre} (ref 0.00–0.15)
Total Protein, Urine: 7 mg/dL

## 2017-05-03 LAB — CBC
HCT: 32.8 % — ABNORMAL LOW (ref 36.0–46.0)
Hemoglobin: 11 g/dL — ABNORMAL LOW (ref 12.0–15.0)
MCH: 28 pg (ref 26.0–34.0)
MCHC: 33.5 g/dL (ref 30.0–36.0)
MCV: 83.5 fL (ref 78.0–100.0)
PLATELETS: 262 10*3/uL (ref 150–400)
RBC: 3.93 MIL/uL (ref 3.87–5.11)
RDW: 14.3 % (ref 11.5–15.5)
WBC: 6.1 10*3/uL (ref 4.0–10.5)

## 2017-05-03 LAB — LACTATE DEHYDROGENASE: LDH: 178 U/L (ref 98–192)

## 2017-05-03 LAB — OB RESULTS CONSOLE GC/CHLAMYDIA
CHLAMYDIA, DNA PROBE: NEGATIVE
GC PROBE AMP, GENITAL: NEGATIVE

## 2017-05-03 LAB — OB RESULTS CONSOLE HEPATITIS B SURFACE ANTIGEN: Hepatitis B Surface Ag: NEGATIVE

## 2017-05-03 LAB — OB RESULTS CONSOLE HIV ANTIBODY (ROUTINE TESTING): HIV: NONREACTIVE

## 2017-05-03 LAB — OB RESULTS CONSOLE RPR: RPR: NONREACTIVE

## 2017-05-03 LAB — URIC ACID: Uric Acid, Serum: 4.8 mg/dL (ref 2.3–6.6)

## 2017-05-03 MED ORDER — OXYCODONE-ACETAMINOPHEN 5-325 MG PO TABS: 1.0000 | ORAL_TABLET | ORAL | Status: DC | PRN

## 2017-05-03 MED ORDER — OXYCODONE-ACETAMINOPHEN 5-325 MG PO TABS: 2.0000 | ORAL_TABLET | ORAL | Status: DC | PRN

## 2017-05-03 MED ORDER — BUTORPHANOL TARTRATE 1 MG/ML IJ SOLN
1.0000 mg | INTRAMUSCULAR | Status: DC | PRN
  Administered 2017-05-04 (×2): 1 mg via INTRAVENOUS
  Filled 2017-05-03 (×2): qty 1

## 2017-05-03 MED ORDER — LACTATED RINGERS IV SOLN: 500.0000 mL | INTRAVENOUS | Status: DC | PRN

## 2017-05-03 MED ORDER — OXYTOCIN 40 UNITS IN LACTATED RINGERS INFUSION - SIMPLE MED: 2.5000 [IU]/h | INTRAVENOUS | Status: DC

## 2017-05-03 MED ORDER — SOD CITRATE-CITRIC ACID 500-334 MG/5ML PO SOLN: 30.0000 mL | ORAL | Status: DC | PRN

## 2017-05-03 MED ORDER — ONDANSETRON HCL 4 MG/2ML IJ SOLN
4.0000 mg | Freq: Four times a day (QID) | INTRAMUSCULAR | Status: DC | PRN
Start: 2017-05-03 — End: 2017-05-04

## 2017-05-03 MED ORDER — ACETAMINOPHEN 325 MG PO TABS: 650.0000 mg | ORAL_TABLET | ORAL | Status: DC | PRN

## 2017-05-03 MED ORDER — OXYTOCIN 40 UNITS IN LACTATED RINGERS INFUSION - SIMPLE MED
1.0000 m[IU]/min | INTRAVENOUS | Status: DC
  Administered 2017-05-04: 2 m[IU]/min via INTRAVENOUS
  Filled 2017-05-03: qty 1000

## 2017-05-03 MED ORDER — MISOPROSTOL 25 MCG QUARTER TABLET
25.0000 ug | ORAL_TABLET | ORAL | Status: DC | PRN
  Administered 2017-05-03: 25 ug via VAGINAL
  Filled 2017-05-03 (×3): qty 1

## 2017-05-03 MED ORDER — LACTATED RINGERS IV SOLN: INTRAVENOUS | Status: DC

## 2017-05-03 MED ORDER — LIDOCAINE HCL (PF) 1 % IJ SOLN
30.0000 mL | INTRAMUSCULAR | Status: DC | PRN
  Filled 2017-05-03: qty 30

## 2017-05-03 MED ORDER — TERBUTALINE SULFATE 1 MG/ML IJ SOLN
0.2500 mg | Freq: Once | INTRAMUSCULAR | Status: DC | PRN
  Filled 2017-05-03: qty 1

## 2017-05-03 MED ORDER — HYDRALAZINE HCL 20 MG/ML IJ SOLN: 10.0000 mg | Freq: Once | INTRAMUSCULAR | Status: DC | PRN

## 2017-05-03 MED ORDER — LABETALOL HCL 5 MG/ML IV SOLN
20.0000 mg | INTRAVENOUS | Status: DC | PRN
Start: 2017-05-03 — End: 2017-05-04

## 2017-05-03 MED ORDER — OXYTOCIN BOLUS FROM INFUSION
500.0000 mL | Freq: Once | INTRAVENOUS | Status: AC
  Administered 2017-05-04: 500 mL via INTRAVENOUS

## 2017-05-03 NOTE — Progress Notes (Signed)
Julious PayerLatoya S Dunkleberger is a 31 y.o. G2P1000 at 8613w0d by LMP admitted for induction of labor due to gestational hypertension .  Subjective: cytotec placed at 5:36 pm .. Pt denies contractions occasional cramping. +FM no lof no vaginal bleeding. No headache. No ruq pain. scotomata  Objective: BP (!) 142/79   Pulse 66   Temp 98.2 F (36.8 C) (Oral)   Resp 16   Ht 5\' 7"  (1.702 m)   Wt 93 kg (205 lb)   BMI 32.11 kg/m  No intake/output data recorded. No intake/output data recorded.  FHT:  FHR: 140 bpm, variability: moderate,  accelerations:  Present,  decelerations:  Absent UC:   none SVE:   Dilation: 1 Effacement (%): 50 Station: -1  Labs: Lab Results  Component Value Date   WBC 6.1 05/03/2017   HGB 11.0 (L) 05/03/2017   HCT 32.8 (L) 05/03/2017   MCV 83.5 05/03/2017   PLT 262 05/03/2017    Assessment / Plan: Induction of labor due to gestational hypertension,  progressing well on pitocin  Labor: cytotec for cervical ripening.  Preeclampsia:  labs stable Fetal Wellbeing:  Category I Pain Control:  planning epidural  I/D:  n/a Anticipated MOD:  NSVD  Bunnie Rehberg J. 05/03/2017, 6:45 PM

## 2017-05-03 NOTE — Anesthesia Pain Management Evaluation Note (Signed)
  [facility]NA Pain Management Visit Note  Patient: Jennifer Dawson, 31 y.o., female  "Hello I am a member of the anesthesia team at [facility]. We have an anesthesia team available at all times to provide care throughout the hospital, including epidural management and anesthesia for C-section. I don't know your plan for the delivery whether it a natural birth, water birth, IV sedation, nitrous supplementation, doula or epidural, but we want to meet your pain goals."   1.Was your pain managed to your expectations on prior hospitalizations?   Yes   2.What is your expectation for pain management during this hospitalization?     Epidural  3.How can we help you reach that goal? Epidural when desired  Record the patient's initial score and the patient's pain goal.   Pain: 2  Pain Goal: 5 [facility] wants you to be able to say your pain was always managed very well.  Shivaun Bilello 05/03/2017

## 2017-05-03 NOTE — H&P (Signed)
Jennifer Dawson is a 31 y.o. female  G2P10001 at 38 wks and 0 days based on presenting for induction of labor due to gestational hypertension possible preeclampsia. Pt reports visual disturbances scotomata. She denies headache currently. Denies ruq pain. +FM. Irregular contractions. Every 10 minutes. Prenatal care provided by Dr. Gerald Leitz.    OB History    Gravida Para Term Preterm AB Living   2 1 1  0 0     SAB TAB Ectopic Multiple Live Births   0 0 0          Obstetric Comments   BP elevation, started at 7 months     Past Medical History:  Diagnosis Date  . Abnormal pap 2005   s/p cryotherapy  . Allergy to yellow jackets    hives, shortness of breath  . 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 PIH (pregnancy induced hypertension)    resolved after pregnancy; didn't require medication  . Pregnancy induced hypertension    first preg at 7 months  . Seasonal allergies   . Vaginal Pap smear, abnormal    with first preg, normal after   Past Surgical History:  Procedure Laterality Date  . NO PAST SURGERIES     Family History: family history includes Breast cancer in her maternal grandmother; Cancer in her maternal grandmother; Diabetes in her paternal grandmother; Gout in her mother; Hypertension in her father and paternal grandmother. Social History:  reports that she has never smoked. She has never used smokeless tobacco. She reports that she does not drink alcohol or use drugs.     Maternal Diabetes: No Genetic Screening: Normal Maternal Ultrasounds/Referrals: Normal Fetal Ultrasounds or other Referrals:  None Maternal Substance Abuse:  No Significant Maternal Medications:  None Significant Maternal Lab Results:  Lab values include: Group B Strep negative Other Comments:  None  Review of Systems  Constitutional: Negative.   HENT: Negative.   Eyes: Negative.   Respiratory: Negative.   Cardiovascular: Negative.   Gastrointestinal:  Negative.   Genitourinary: Negative.   Musculoskeletal: Negative.   Skin: Negative.   Neurological: Negative.   Endo/Heme/Allergies: Negative.   Psychiatric/Behavioral: Negative.    Maternal Medical History:  Reason for admission: Contractions.   Contractions: Onset was yesterday.   Frequency: irregular.   Perceived severity is mild.    Fetal activity: Perceived fetal activity is normal.   Last perceived fetal movement was within the past hour.    Prenatal complications: PIH.   No HIV.   Prenatal Complications - Diabetes: none.    Dilation: 1 Effacement (%): 50 Station: -1 Blood pressure (!) 142/79, pulse 66, temperature 98.2 F (36.8 C), temperature source Oral, resp. rate 16, height 5\' 7"  (1.702 m), weight 93 kg (205 lb). Maternal Exam:  Abdomen: Fetal presentation: vertex  Introitus: Normal vulva. Pelvis: adequate for delivery.      Physical Exam  Vitals reviewed. Constitutional: She is oriented to person, place, and time. She appears well-developed and well-nourished.  HENT:  Head: Normocephalic and atraumatic.  Eyes: Conjunctivae are normal. Pupils are equal, round, and reactive to light.  Neck: Normal range of motion. Neck supple.  Cardiovascular: Normal rate and regular rhythm.   Respiratory: Effort normal and breath sounds normal.  GI: There is no tenderness.  Genitourinary: Vagina normal.  Musculoskeletal: Normal range of motion. She exhibits edema.  Neurological: She is alert and oriented to person, place, and time. She has normal reflexes.  Skin:  Skin is warm and dry.  Psychiatric: She has a normal mood and affect.    Prenatal labs: ABO, Rh: O/Positive/-- (06/14 1634) Antibody: Negative (06/14 1634) Rubella: Immune (06/14 1633) RPR: Nonreactive (06/14 1633)  HBsAg: Negative (06/14 1633)  HIV: Non-reactive (06/14 1633)  GBS: Negative (06/14 1633)   Assessment/Plan: 38 wks and 0 days with gestational hypertension rule out preeclampsia.  Admitted  for induction Cytotec for cervical ripening.  GBS negative.  PIH labs  Anticipate SVD    Jennifer Dawson J. 05/03/2017, 5:56 PM

## 2017-05-04 ENCOUNTER — Inpatient Hospital Stay (HOSPITAL_COMMUNITY): Payer: 59 | Admitting: Anesthesiology

## 2017-05-04 ENCOUNTER — Encounter (HOSPITAL_COMMUNITY): Payer: Self-pay | Admitting: *Deleted

## 2017-05-04 LAB — CBC
HEMATOCRIT: 32.2 % — AB (ref 36.0–46.0)
HEMATOCRIT: 31.9 % — AB (ref 36.0–46.0)
HEMOGLOBIN: 10.8 g/dL — AB (ref 12.0–15.0)
HEMOGLOBIN: 10.6 g/dL — AB (ref 12.0–15.0)
MCH: 28.1 pg (ref 26.0–34.0)
MCH: 27.8 pg (ref 26.0–34.0)
MCHC: 33.5 g/dL (ref 30.0–36.0)
MCHC: 33.2 g/dL (ref 30.0–36.0)
MCV: 83.6 fL (ref 78.0–100.0)
MCV: 83.7 fL (ref 78.0–100.0)
Platelets: 243 10*3/uL (ref 150–400)
Platelets: 222 10*3/uL (ref 150–400)
RBC: 3.85 MIL/uL — AB (ref 3.87–5.11)
RBC: 3.81 MIL/uL — ABNORMAL LOW (ref 3.87–5.11)
RDW: 14.5 % (ref 11.5–15.5)
RDW: 14.5 % (ref 11.5–15.5)
WBC: 7.1 10*3/uL (ref 4.0–10.5)
WBC: 6 10*3/uL (ref 4.0–10.5)

## 2017-05-04 MED ORDER — COCONUT OIL OIL: 1.0000 "application " | TOPICAL_OIL | Status: DC | PRN

## 2017-05-04 MED ORDER — PHENYLEPHRINE 40 MCG/ML (10ML) SYRINGE FOR IV PUSH (FOR BLOOD PRESSURE SUPPORT)
80.0000 ug | PREFILLED_SYRINGE | INTRAVENOUS | Status: DC | PRN
  Filled 2017-05-04: qty 10
  Filled 2017-05-04: qty 5

## 2017-05-04 MED ORDER — LORATADINE 10 MG PO TABS
10.0000 mg | ORAL_TABLET | Freq: Every day | ORAL | Status: DC
  Administered 2017-05-04 – 2017-05-06 (×3): 10 mg via ORAL
  Filled 2017-05-04 (×3): qty 1

## 2017-05-04 MED ORDER — EPHEDRINE 5 MG/ML INJ
10.0000 mg | INTRAVENOUS | Status: DC | PRN
  Filled 2017-05-04: qty 2

## 2017-05-04 MED ORDER — LACTATED RINGERS IV SOLN
500.0000 mL | Freq: Once | INTRAVENOUS | Status: AC
  Administered 2017-05-04: 500 mL via INTRAVENOUS

## 2017-05-04 MED ORDER — PHENYLEPHRINE 40 MCG/ML (10ML) SYRINGE FOR IV PUSH (FOR BLOOD PRESSURE SUPPORT)
80.0000 ug | PREFILLED_SYRINGE | INTRAVENOUS | Status: DC | PRN
  Filled 2017-05-04: qty 5

## 2017-05-04 MED ORDER — PRENATAL MULTIVITAMIN CH
1.0000 | ORAL_TABLET | Freq: Every day | ORAL | Status: DC
  Administered 2017-05-04: 1 via ORAL
  Filled 2017-05-04 (×2): qty 1

## 2017-05-04 MED ORDER — FERROUS SULFATE 325 (65 FE) MG PO TABS
325.0000 mg | ORAL_TABLET | Freq: Two times a day (BID) | ORAL | Status: DC
  Administered 2017-05-04 – 2017-05-06 (×4): 325 mg via ORAL
  Filled 2017-05-04 (×5): qty 1

## 2017-05-04 MED ORDER — BENZOCAINE-MENTHOL 20-0.5 % EX AERO
1.0000 "application " | INHALATION_SPRAY | CUTANEOUS | Status: DC | PRN
  Administered 2017-05-04: 1 via TOPICAL
  Filled 2017-05-04: qty 56

## 2017-05-04 MED ORDER — ZOLPIDEM TARTRATE 5 MG PO TABS: 5.0000 mg | ORAL_TABLET | Freq: Every evening | ORAL | Status: DC | PRN

## 2017-05-04 MED ORDER — FENTANYL 2.5 MCG/ML BUPIVACAINE 1/10 % EPIDURAL INFUSION (WH - ANES)
14.0000 mL/h | INTRAMUSCULAR | Status: DC | PRN
  Administered 2017-05-04 (×2): 14 mL/h via EPIDURAL
  Filled 2017-05-04: qty 100

## 2017-05-04 MED ORDER — DIBUCAINE 1 % RE OINT: 1.0000 "application " | TOPICAL_OINTMENT | RECTAL | Status: DC | PRN

## 2017-05-04 MED ORDER — SIMETHICONE 80 MG PO CHEW: 80.0000 mg | CHEWABLE_TABLET | ORAL | Status: DC | PRN

## 2017-05-04 MED ORDER — SENNOSIDES-DOCUSATE SODIUM 8.6-50 MG PO TABS
2.0000 | ORAL_TABLET | ORAL | Status: DC
  Administered 2017-05-04 – 2017-05-05 (×2): 2 via ORAL
  Filled 2017-05-04 (×3): qty 2

## 2017-05-04 MED ORDER — ONDANSETRON HCL 4 MG PO TABS: 4.0000 mg | ORAL_TABLET | ORAL | Status: DC | PRN

## 2017-05-04 MED ORDER — ACETAMINOPHEN 325 MG PO TABS
650.0000 mg | ORAL_TABLET | ORAL | Status: DC | PRN
Start: 2017-05-04 — End: 2017-05-06

## 2017-05-04 MED ORDER — DIPHENHYDRAMINE HCL 50 MG/ML IJ SOLN: 12.5000 mg | INTRAMUSCULAR | Status: DC | PRN

## 2017-05-04 MED ORDER — ONDANSETRON HCL 4 MG/2ML IJ SOLN: 4.0000 mg | INTRAMUSCULAR | Status: DC | PRN

## 2017-05-04 MED ORDER — SERTRALINE HCL 25 MG PO TABS
25.0000 mg | ORAL_TABLET | Freq: Every day | ORAL | Status: DC
  Administered 2017-05-04 – 2017-05-06 (×3): 25 mg via ORAL
  Filled 2017-05-04 (×4): qty 1

## 2017-05-04 MED ORDER — LIDOCAINE HCL (PF) 1 % IJ SOLN
INTRAMUSCULAR | Status: DC | PRN
  Administered 2017-05-04 (×2): 5 mL

## 2017-05-04 MED ORDER — DIPHENHYDRAMINE HCL 25 MG PO CAPS: 25.0000 mg | ORAL_CAPSULE | Freq: Four times a day (QID) | ORAL | Status: DC | PRN

## 2017-05-04 MED ORDER — IBUPROFEN 600 MG PO TABS
600.0000 mg | ORAL_TABLET | Freq: Four times a day (QID) | ORAL | Status: DC
  Administered 2017-05-04 – 2017-05-06 (×9): 600 mg via ORAL
  Filled 2017-05-04 (×9): qty 1

## 2017-05-04 MED ORDER — PANTOPRAZOLE SODIUM 40 MG PO TBEC
40.0000 mg | DELAYED_RELEASE_TABLET | Freq: Every day | ORAL | Status: DC
  Administered 2017-05-04 – 2017-05-06 (×3): 40 mg via ORAL
  Filled 2017-05-04 (×3): qty 1

## 2017-05-04 MED ORDER — WITCH HAZEL-GLYCERIN EX PADS: 1.0000 "application " | MEDICATED_PAD | CUTANEOUS | Status: DC | PRN

## 2017-05-04 NOTE — Anesthesia Procedure Notes (Signed)
Epidural Patient location during procedure: OB  Staffing Anesthesiologist: Braiden Presutti Performed: anesthesiologist   Preanesthetic Checklist Completed: patient identified, site marked, surgical consent, pre-op evaluation, timeout performed, IV checked, risks and benefits discussed and monitors and equipment checked  Epidural Patient position: sitting Prep: DuraPrep Patient monitoring: heart rate, continuous pulse ox and blood pressure Approach: right paramedian Location: L3-L4 Injection technique: LOR saline  Needle:  Needle type: Tuohy  Needle gauge: 17 G Needle length: 9 cm and 9 Needle insertion depth: 6 cm Catheter type: closed end flexible Catheter size: 20 Guage Catheter at skin depth: 10 cm Test dose: negative  Assessment Events: blood not aspirated, injection not painful, no injection resistance, negative IV test and no paresthesia  Additional Notes Patient identified. Risks/Benefits/Options discussed with patient including but not limited to bleeding, infection, nerve damage, paralysis, failed block, incomplete pain control, headache, blood pressure changes, nausea, vomiting, reactions to medication both or allergic, itching and postpartum back pain. Confirmed with bedside nurse the patient's most recent platelet count. Confirmed with patient that they are not currently taking any anticoagulation, have any bleeding history or any family history of bleeding disorders. Patient expressed understanding and wished to proceed. All questions were answered. Sterile technique was used throughout the entire procedure. Please see nursing notes for vital signs. Test dose was given through epidural needle and negative prior to continuing to dose epidural or start infusion. Warning signs of high block given to the patient including shortness of breath, tingling/numbness in hands, complete motor block, or any concerning symptoms with instructions to call for help. Patient was given  instructions on fall risk and not to get out of bed. All questions and concerns addressed with instructions to call with any issues.     

## 2017-05-04 NOTE — Anesthesia Postprocedure Evaluation (Signed)
Anesthesia Post Note  Patient: Jennifer Dawson  Procedure(s) Performed: * No procedures listed *     Patient location during evaluation: Mother Baby Anesthesia Type: Epidural Level of consciousness: awake and alert and oriented Pain management: pain level controlled Vital Signs Assessment: post-procedure vital signs reviewed and stable Respiratory status: spontaneous breathing and nonlabored ventilation Cardiovascular status: stable Postop Assessment: no headache, patient able to bend at knees, no backache, no signs of nausea or vomiting, epidural receding and adequate PO intake Anesthetic complications: no    Last Vitals:  Vitals:   05/04/17 1330 05/04/17 1731  BP: 121/63 (!) 132/59  Pulse: (!) 55 60  Resp: 18 17  Temp: 36.5 C 36.7 C    Last Pain:  Vitals:   05/04/17 1731  TempSrc: Oral  PainSc:    Pain Goal:                 Land O'LakesMalinova,Chayim Bialas Hristova

## 2017-05-04 NOTE — Lactation Note (Signed)
This note was copied from a baby's chart. Lactation Consultation Note  Patient Name: Jennifer Dawson BJYNW'GToday's Date: 05/04/2017 Reason for consult: Initial assessment  Initial visit at 12 hours of age.  Baby asleep in crib.  Mom reports experience with older child (now 7612) nursing for 3 months and then stopped due to lactose intolerance.   Mom reports this baby is doing well with feedings and mom is seeing colostrum.   Baby is 6#4oz at 4676w1d.  LC encouraged mom to keep baby awake and active during feedings.  LC encouraged mom to work on hand expression and spoon feeding to prevent baby from loosing to much weight during hospital stay.  [facility] given and discussed.  Encouraged to feed with early cues on demand 8-12x/day and waking baby as needed.  Early newborn behavior discussed.   Mom to call for assist as needed.  LC answered questions regarding pumping when she returns to work in 6 weeks and questions about galactogogue to increase supply.      Maternal Data Has patient been taught Hand Expression?: Yes Does the patient have breastfeeding experience prior to this delivery?: Yes  Feeding    LATCH Score/Interventions                Intervention(s): Breastfeeding basics reviewed     Lactation Tools Discussed/Used     Consult Status Consult Status: Follow-up Date: 05/05/17 Follow-up type: In-patient    Beverely RisenShoptaw, Arvella MerlesJana Lynn 05/04/2017, 9:58 PM

## 2017-05-04 NOTE — Anesthesia Preprocedure Evaluation (Signed)
Anesthesia Evaluation  Patient identified by MRN, date of birth, ID band Patient awake    Reviewed: Allergy & Precautions, H&P , NPO status , Patient's Chart, lab work & pertinent test results  History of Anesthesia Complications Negative for: history of anesthetic complications  Airway Mallampati: II  TM Distance: >3 FB Neck ROM: full    Dental no notable dental hx. (+) Teeth Intact   Pulmonary neg pulmonary ROS,    Pulmonary exam normal breath sounds clear to auscultation       Cardiovascular hypertension, Normal cardiovascular exam Rhythm:regular Rate:Normal     Neuro/Psych negative neurological ROS  negative psych ROS   GI/Hepatic negative GI ROS, Neg liver ROS,   Endo/Other  negative endocrine ROS  Renal/GU negative Renal ROS  negative genitourinary   Musculoskeletal   Abdominal   Peds  Hematology negative hematology ROS (+)   Anesthesia Other Findings   Reproductive/Obstetrics (+) Pregnancy                             Anesthesia Physical Anesthesia Plan  ASA: II  Anesthesia Plan: Epidural   Post-op Pain Management:    Induction:   PONV Risk Score and Plan:   Airway Management Planned:   Additional Equipment:   Intra-op Plan:   Post-operative Plan:   Informed Consent: I have reviewed the patients History and Physical, chart, labs and discussed the procedure including the risks, benefits and alternatives for the proposed anesthesia with the patient or authorized representative who has indicated his/her understanding and acceptance.     Plan Discussed with:   Anesthesia Plan Comments:         Anesthesia Quick Evaluation  

## 2017-05-04 NOTE — Progress Notes (Signed)
Jennifer Dawson is a 31 y.o. G2P1000 at 8824w1d by LMP admitted for induction of labor due to gestational hypertension .  Subjective: Patient is uncomfortable with contractions and desires epidural. SROM clear fluid at 6 am.  +FM    Objective: BP 135/85   Pulse 63   Temp 98.4 F (36.9 C) (Oral)   Resp 18   Ht 5\' 7"  (1.702 m)   Wt 93 kg (205 lb)   BMI 32.11 kg/m  No intake/output data recorded. No intake/output data recorded.  FHT:  FHR: 125 bpm, variability: minimal ,  accelerations:  Present,  decelerations:  Present variable  UC:   regular, every 2 minutes SVE:   Exam deferred per patient request. She desires to wait until after she receives her epidural. Last exam at 2 am 1/70/-2 Labs: Lab Results  Component Value Date   WBC 7.1 05/04/2017   HGB 10.8 (L) 05/04/2017   HCT 32.2 (L) 05/04/2017   MCV 83.6 05/04/2017   PLT 243 05/04/2017    Assessment / Plan: induction of labor due to gesational hypertension   Labor:  plan cervical exam after epidural placed. continue pitocin  Preeclampsia:  labs stable Fetal Wellbeing:  Category I and Category II mostly category 1 will monitor closely    Pain Control:  planning epidural I/D:  n/a Anticipated MOD:  NSVD  Jennifer Dawson J. 05/04/2017, 7:31 AM

## 2017-05-05 LAB — CBC
HEMATOCRIT: 29 % — AB (ref 36.0–46.0)
Hemoglobin: 9.8 g/dL — ABNORMAL LOW (ref 12.0–15.0)
MCH: 28.2 pg (ref 26.0–34.0)
MCHC: 33.8 g/dL (ref 30.0–36.0)
MCV: 83.3 fL (ref 78.0–100.0)
Platelets: 214 10*3/uL (ref 150–400)
RBC: 3.48 MIL/uL — ABNORMAL LOW (ref 3.87–5.11)
RDW: 14.8 % (ref 11.5–15.5)
WBC: 6.7 10*3/uL (ref 4.0–10.5)

## 2017-05-05 LAB — RPR: RPR: NONREACTIVE

## 2017-05-05 MED ORDER — COMPLETENATE 29-1 MG PO CHEW
1.0000 | CHEWABLE_TABLET | Freq: Every day | ORAL | Status: DC
  Administered 2017-05-05 – 2017-05-06 (×2): 1 via ORAL
  Filled 2017-05-05 (×3): qty 1

## 2017-05-05 NOTE — Progress Notes (Signed)
Postpartum day #1, NSVD  Subjective Pt without complaints.  Lochia normal.  Pain controlled.  Breast feeding yes  Temp:  [97.9 F (36.6 C)-98 F (36.7 C)] 97.9 F (36.6 C) (06/16 0543) Pulse Rate:  [60-61] 61 (06/16 0543) Resp:  [17-18] 18 (06/16 0543) BP: (126-132)/(59-64) 126/64 (06/16 0543)  Gen:  NAD, A&O x 3 Uterine fundus:  Firm, nontender Lochia normal Ext:  +Edema, no calf tenderness bilaterally  CBC    Component Value Date/Time   WBC 6.7 05/05/2017 0615   RBC 3.48 (L) 05/05/2017 0615   HGB 9.8 (L) 05/05/2017 0615   HCT 29.0 (L) 05/05/2017 0615   PLT 214 05/05/2017 0615   MCV 83.3 05/05/2017 0615   MCH 28.2 05/05/2017 0615   MCHC 33.8 05/05/2017 0615   RDW 14.8 05/05/2017 0615   LYMPHSABS 2.6 01/20/2015 0828   MONOABS 0.3 01/20/2015 0828   EOSABS 0.0 01/20/2015 0828   BASOSABS 0.0 01/20/2015 0828     A/P: S/p SVD doing well. Gestational HTN.  BP normal.  Check BP every 6 hours Routine postpartum care. Lactation support. Discharge in am.  Geryl RankinsVARNADO, Jennifer Dawson 05/05/2017, 2:32 PM

## 2017-05-06 MED ORDER — IBUPROFEN 600 MG PO TABS: 600.0000 mg | ORAL_TABLET | Freq: Four times a day (QID) | ORAL | 0 refills | Status: AC

## 2017-05-06 NOTE — Lactation Note (Signed)
This note was copied from a baby's chart. Lactation Consultation Note  Patient Name: Jennifer Doristine MangoLatoya Flinn XLKGM'WToday's Date: 05/06/2017 Reason for consult: Follow-up assessment   With this mom and term baby, now 3048 hours old and small, at 5 lbs 12.8 oz. The baby is at 8% weight loss, with more than adequate wet and dirty diapers. The baby is cluster feeding, one feeding lasting as long as 45 minutes. Mom states the baby is on and off the breast. On exam of her mouth, I noted a short anterior frenulum, and a high palate. I showed this to the parents, and explained this could be why she is having trouble staying latched. I fitted mom with a 20 nipple shield, filed with mom's milk, and mom latched the baby. She did not like the shield, crying and fussy. Mom agreed to trying some Alimentm formula in the shield. It took a few minutes, but she eventually started sucking on the breast with shield, and mom said this latch was much more comfortable, and the baby was causing good breast movement. She was able to drink  6 ml's of formula, then settled in and continued breast feeding. I gave the parents oral restriction resources, and told them nothing needed to be done, but this information was to inform them. Mom advised to breast feed, with or without the shield, at least every 3 hours, and then pump with her DEP after BF, at least every 3 hours. and feed the EBm back to the bay. Mom is fine with using a bottle.  I will fax this information to Dr. Avis Epleyees   Maternal Data    Feeding Feeding Type: Formula Length of feed: 30 min  LATCH Score/Interventions Latch: Repeated attempts needed to sustain latch, nipple held in mouth throughout feeding, stimulation needed to elicit sucking reflex. (baby bobboing head on and off the breast, anterio tongu tie noted, fitted mom iwth 20 nipple shield)  Audible Swallowing: A few with stimulation Intervention(s): Skin to skin;Hand expression  Type of Nipple: Everted at rest and  after stimulation  Comfort (Breast/Nipple): Soft / non-tender  Problem noted: Filling  Hold (Positioning): Assistance needed to correctly position infant at breast and maintain latch. Intervention(s): Breastfeeding basics reviewed;Support Pillows;Position options  LATCH Score: 7  Lactation Tools Discussed/Used Tools: Nipple Shields Nipple shield size: 20   Consult Status Consult Status: Complete Follow-up type: Call as needed    Jennifer Dawson, Jennifer Dawson 05/06/2017, 10:35 AM

## 2017-05-06 NOTE — Discharge Instructions (Signed)
Postpartum Care After Vaginal Delivery ° °The period of time right after you deliver your newborn is called the postpartum period. °What kind of medical care will I receive? °· You may continue to receive fluids and medicines through an IV tube inserted into one of your veins. °· If an incision was made near your vagina (episiotomy) or if you had some vaginal tearing during delivery, cold compresses may be placed on your episiotomy or your tear. This helps to reduce pain and swelling. °· You may be given a squirt bottle to use when you go to the bathroom. You may use this until you are comfortable wiping as usual. To use the squirt bottle, follow these steps: °? Before you urinate, fill the squirt bottle with warm water. Do not use hot water. °? After you urinate, while you are sitting on the toilet, use the squirt bottle to rinse the area around your urethra and vaginal opening. This rinses away any urine and blood. °? You may do this instead of wiping. As you start healing, you may use the squirt bottle before wiping yourself. Make sure to wipe gently. °? Fill the squirt bottle with clean water every time you use the bathroom. °· You will be given sanitary pads to wear. °How can I expect to feel? °· You may not feel the need to urinate for several hours after delivery. °· You will have some soreness and pain in your abdomen and vagina. °· If you are breastfeeding, you may have uterine contractions every time you breastfeed for up to several weeks postpartum. Uterine contractions help your uterus return to its normal size. °· It is normal to have vaginal bleeding (lochia) after delivery. The amount and appearance of lochia is often similar to a menstrual period in the first week after delivery. It will gradually decrease over the next few weeks to a dry, yellow-brown discharge. For most women, lochia stops completely by 6-8 weeks after delivery. Vaginal bleeding can vary from woman to woman. °· Within the first few  days after delivery, you may have breast engorgement. This is when your breasts feel heavy, full, and uncomfortable. Your breasts may also throb and feel hard, tightly stretched, warm, and tender. After this occurs, you may have milk leaking from your breasts. Your health care provider can help you relieve discomfort due to breast engorgement. Breast engorgement should go away within a few days. °· You may feel more sad or worried than normal due to hormonal changes after delivery. These feelings should not last more than a few days. If these feelings do not go away after several days, speak with your health care provider. °How should I care for myself? °· Tell your health care provider if you have pain or discomfort. °· Drink enough water to keep your urine clear or pale yellow. °· Wash your hands thoroughly with soap and water for at least 20 seconds after changing your sanitary pads, after using the toilet, and before holding or feeding your baby. °· If you are not breastfeeding, avoid touching your breasts a lot. Doing this can make your breasts produce more milk. °· If you become weak or lightheaded, or you feel like you might faint, ask for help before: °? Getting out of bed. °? Showering. °· Change your sanitary pads frequently. Watch for any changes in your flow, such as a sudden increase in volume, a change in color, the passing of large blood clots. If you pass a blood clot from your vagina,   save it to show to your health care provider. Do not flush blood clots down the toilet without having your health care provider look at them. °· Make sure that all your vaccinations are up to date. This can help protect you and your baby from getting certain diseases. You may need to have immunizations done before you leave the hospital. °· If desired, talk with your health care provider about methods of family planning or birth control (contraception). °How can I start bonding with my baby? °Spending as much time as  possible with your baby is very important. During this time, you and your baby can get to know each other and develop a bond. Having your baby stay with you in your room (rooming in) can give you time to get to know your baby. Rooming in can also help you become comfortable caring for your baby. Breastfeeding can also help you bond with your baby. °How can I plan for returning home with my baby? °· Make sure that you have a car seat installed in your vehicle. °? Your car seat should be checked by a certified car seat installer to make sure that it is installed safely. °? Make sure that your baby fits into the car seat safely. °· Ask your health care provider any questions you have about caring for yourself or your baby. Make sure that you are able to contact your health care provider with any questions after leaving the hospital. °This information is not intended to replace advice given to you by your health care provider. Make sure you discuss any questions you have with your health care provider. °Document Released: 09/03/2007 Document Revised: 04/10/2016 Document Reviewed: 10/11/2015 °Elsevier Interactive Patient Education © 2018 [employer] ° ° °Postpartum Depression and Baby Blues °The postpartum period begins right after the birth of a baby. During this time, there is often a great amount of joy and excitement. It is also a time of many changes in the life of the parents. Regardless of how many times a mother gives birth, each child brings new challenges and dynamics to the family. It is not unusual to have feelings of excitement along with confusing shifts in moods, emotions, and thoughts. All mothers are at risk of developing postpartum depression or the "baby blues." These mood changes can occur right after giving birth, or they may occur many months after giving birth. The baby blues or postpartum depression can be mild or severe. Additionally, postpartum depression can go away rather quickly, or it can  be a long-term condition. °What are the causes? °Raised hormone levels and the rapid drop in those levels are thought to be a main cause of postpartum depression and the baby blues. A number of hormones change during and after pregnancy. Estrogen and progesterone usually decrease right after the delivery of your baby. The levels of thyroid hormone and various cortisol steroids also rapidly drop. Other factors that play a role in these mood changes include major life events and genetics. °What increases the risk? °If you have any of the following risks for the baby blues or postpartum depression, know what symptoms to watch out for during the postpartum period. Risk factors that may increase the likelihood of getting the baby blues or postpartum depression include: °· Having a personal or family history of depression. °· Having depression while being pregnant. °· Having premenstrual mood issues or mood issues related to oral contraceptives. °· Having a lot of life stress. °· Having marital conflict. °· Lacking   a social support network. °· Having a baby with special needs. °· Having health problems, such as diabetes. ° °What are the signs or symptoms? °Symptoms of baby blues include: °· Brief changes in mood, such as going from extreme happiness to sadness. °· Decreased concentration. °· Difficulty sleeping. °· Crying spells, tearfulness. °· Irritability. °· Anxiety. ° °Symptoms of postpartum depression typically begin within the first month after giving birth. These symptoms include: °· Difficulty sleeping or excessive sleepiness. °· Marked weight loss. °· Agitation. °· Feelings of worthlessness. °· Lack of interest in activity or food. ° °Postpartum psychosis is a very serious condition and can be dangerous. Fortunately, it is rare. Displaying any of the following symptoms is cause for immediate medical attention. Symptoms of postpartum psychosis include: °· Hallucinations and delusions. °· Bizarre or disorganized  behavior. °· Confusion or disorientation. ° °How is this diagnosed? °A diagnosis is made by an evaluation of your symptoms. There are no medical or lab tests that lead to a diagnosis, but there are various questionnaires that a health care provider may use to identify those with the baby blues, postpartum depression, or psychosis. Often, a screening tool called the Edinburgh Postnatal Depression Scale is used to diagnose depression in the postpartum period. °How is this treated? °The baby blues usually goes away on its own in 1-2 weeks. Social support is often all that is needed. You will be encouraged to get adequate sleep and rest. Occasionally, you may be given medicines to help you sleep. °Postpartum depression requires treatment because it can last several months or longer if it is not treated. Treatment may include individual or group therapy, medicine, or both to address any social, physiological, and psychological factors that may play a role in the depression. Regular exercise, a healthy diet, rest, and social support may also be strongly recommended. °Postpartum psychosis is more serious and needs treatment right away. Hospitalization is often needed. °Follow these instructions at home: °· Get as much rest as you can. Nap when the baby sleeps. °· Exercise regularly. Some women find yoga and walking to be beneficial. °· Eat a balanced and nourishing diet. °· Do little things that you enjoy. Have a cup of tea, take a bubble bath, read your favorite magazine, or listen to your favorite music. °· Avoid alcohol. °· Ask for help with household chores, cooking, grocery shopping, or running errands as needed. Do not try to do everything. °· Talk to people close to you about how you are feeling. Get support from your partner, family members, friends, or other new moms. °· Try to stay positive in how you think. Think about the things you are grateful for. °· Do not spend a lot of time alone. °· Only take  over-the-counter or prescription medicine as directed by your health care provider. °· Keep all your postpartum appointments. °· Let your health care provider know if you have any concerns. °Contact a health care provider if: °You are having a reaction to or problems with your medicine. °Get help right away if: °· You have suicidal feelings. °· You think you may harm the baby or someone else. °This information is not intended to replace advice given to you by your health care provider. Make sure you discuss any questions you have with your health care provider. °Document Released: 08/10/2004 Document Revised: 04/13/2016 Document Reviewed: 08/18/2013 °Elsevier Interactive Patient Education © 2017 [employer] ° ° °

## 2017-05-06 NOTE — Progress Notes (Signed)
MOB was referred for history of depression/anxiety.  Referral is screened out by Clinical Social Worker because none of the following criteria appear to apply and there are no reports impacting the pregnancy or her transition to the postpartum period.  CSW does not deem it clinically necessary to further investigate at this time.   -History of anxiety/depression during this pregnancy, or of post-partum depression.  - Diagnosis of anxiety and/or depression within last 3 years.-  - History of depression due to pregnancy loss/loss of child or -MOB's symptoms are currently being treated with medication and/or therapy.  Please contact the Clinical Social Worker if needs arise or upon MOB request.    Daion Ginsberg, MSW, LCSW-A Clinical Social Worker  [facility]  Office: [phone]   

## 2017-05-06 NOTE — Progress Notes (Signed)
Postpartum day #2, NSVD  Subjective Pt without complaints.  Lochia normal.  Pain controlled.  Breast feeding yes.  More LE swelling but went down overnight.  Temp:  [98.3 F (36.8 C)-98.4 F (36.9 C)] 98.4 F (36.9 C) (06/16 2345) Pulse Rate:  [66-75] 66 (06/16 2345) Resp:  [18-19] 18 (06/16 2345) BP: (122-139)/(74-86) 139/86 (06/16 2345) SpO2:  [99 %] 99 % (06/16 1700)  Gen:  NAD, A&O x 3 Uterine fundus:  Firm, nontender Lochia normal Ext:  +Edema, no calf tenderness bilaterally  CBC    Component Value Date/Time   WBC 6.7 05/05/2017 0615   RBC 3.48 (L) 05/05/2017 0615   HGB 9.8 (L) 05/05/2017 0615   HCT 29.0 (L) 05/05/2017 0615   PLT 214 05/05/2017 0615   MCV 83.3 05/05/2017 0615   MCH 28.2 05/05/2017 0615   MCHC 33.8 05/05/2017 0615   RDW 14.8 05/05/2017 0615   LYMPHSABS 2.6 01/20/2015 0828   MONOABS 0.3 01/20/2015 0828   EOSABS 0.0 01/20/2015 0828   BASOSABS 0.0 01/20/2015 0828     A/P: S/p SVD doing well. GHTN-BP normal but has trended up.  Check BP q 4.  If normal discharge home with BP check in 1 week. Routine postpartum care. Lactation support. Discharge today. H/o anxiety, on Zoloft. Postpartum Depression/Anxiety precautions given. Undecided about contraception.  Jennifer Dawson 05/06/2017, 6:08 AM

## 2017-06-08 NOTE — Discharge Summary (Signed)
OB Discharge Summary     Patient Name: Jennifer PayerLatoya S Dawson DOB: June 21, 1986 MRN: [id number]  Date of admission: 05/03/2017 Delivering MD: Gerald LeitzOLE, TARA   Date of discharge: 06/08/2017  Admitting diagnosis: 38w,Induction  Intrauterine pregnancy: 7013w1d     Secondary diagnosis:  Active Problems:   Gestational hypertension  Additional problems: Anxiety     Discharge diagnosis: Term Pregnancy Delivered                                                                                                Post partum procedures:None  Augmentation: Pitocin and Cytotec  Complications: None  Hospital course:  Induction of Labor With Vaginal Delivery   31 y.o. yo G2P2001 at 5213w1d was admitted to the hospital 05/03/2017 for induction of labor.  Indication for induction: Gestational hypertension.  Patient had an uncomplicated labor course as follows: Membrane Rupture Time/Date: 6:06 AM ,05/04/2017   Intrapartum Procedures: Episiotomy: None [1]                                         Lacerations:  Periurethral [8]  Patient had delivery of a Viable infant.  Information for the patient's newborn:  Particia NearingCyrus, Azariah McKenzie [[id number]][[id number]]  Delivery Method: Vag-Spont   05/04/2017  Details of delivery can be found in separate delivery note.  Patient had a routine postpartum course. Patient is discharged home 06/08/17.  Physical exam  Vitals:   05/05/17 1700 05/05/17 2345 05/06/17 0609 05/06/17 1039  BP: 130/74 139/86 135/77 127/73  Pulse: 66 66 (!) 56 65  Resp: 19 18 18 18   Temp: 98.3 F (36.8 C) 98.4 F (36.9 C) 98.3 F (36.8 C) 98.5 F (36.9 C)  TempSrc: Oral Oral Oral Oral  SpO2: 99%     Weight:      Height:       General: alert, cooperative and no distress Lochia: appropriate Uterine Fundus: firm Incision: N/A DVT Evaluation: No evidence of DVT seen on physical exam. No cords or calf tenderness. No significant calf/ankle edema. Calf/Ankle edema is present Labs: Lab Results  Component Value  Date   WBC 6.7 05/05/2017   HGB 9.8 (L) 05/05/2017   HCT 29.0 (L) 05/05/2017   MCV 83.3 05/05/2017   PLT 214 05/05/2017   CMP Latest Ref Rng & Units 05/03/2017  Glucose 65 - 99 mg/dL 94  BUN 6 - 20 mg/dL 7  Creatinine 7.820.44 - 9.561.00 mg/dL 2.130.82  Sodium 086135 - 578145 mmol/L 137  Potassium 3.5 - 5.1 mmol/L 3.9  Chloride 101 - 111 mmol/L 108  CO2 22 - 32 mmol/L 23  Calcium 8.9 - 10.3 mg/dL 9.0  Total Protein 6.5 - 8.1 g/dL 6.4(L)  Total Bilirubin 0.3 - 1.2 mg/dL 0.3  Alkaline Phos 38 - 126 U/L 156(H)  AST 15 - 41 U/L 26  ALT 14 - 54 U/L 18    Discharge instruction: per After Visit Summary and "Baby and Me Booklet".  After visit meds:  Allergies as of 05/06/2017  Reactions   Bee Venom Anaphylaxis   Yellow jackets, per pt      Medication List    TAKE these medications   cetirizine 10 MG tablet Commonly known as:  ZYRTEC Take 10 mg by mouth daily.   EPINEPHrine 0.3 mg/0.3 mL Soaj injection Commonly known as:  EPI-PEN Inject 0.3 mLs (0.3 mg total) into the muscle once. What changed:  when to take this  reasons to take this   ibuprofen 600 MG tablet Commonly known as:  ADVIL,MOTRIN Take 1 tablet (600 mg total) by mouth every 6 (six) hours.   pantoprazole 40 MG tablet Commonly known as:  PROTONIX Take 40 mg by mouth daily.   PRENATAL ADULT GUMMY/DHA/FA PO Take 2 each by mouth daily.   sertraline 25 MG tablet Commonly known as:  ZOLOFT Take 25 mg by mouth daily.       Diet: routine diet  Activity: Advance as tolerated. Pelvic rest for 6 weeks.   Outpatient follow up: 1 week Follow up Appt:No future appointments. Follow up Visit:No Follow-up on file.  Postpartum contraception: Undecided and Diaphragm  Newborn Data: Live born female  Birth Weight: 6 lb 4.5 oz (2850 g) APGAR: 8, 9  Baby Feeding: Breast Disposition:home with mother   06/08/2017 Geryl Rankins, MD

## 2017-08-23 ENCOUNTER — Other Ambulatory Visit (HOSPITAL_COMMUNITY)
Admission: RE | Admit: 2017-08-23 | Discharge: 2017-08-23 | Disposition: A | Discharge status: Home / Self Care | Payer: BLUE CROSS/BLUE SHIELD | Source: Ambulatory Visit | Attending: Obstetrics and Gynecology | Admitting: Obstetrics and Gynecology

## 2017-08-23 ENCOUNTER — Other Ambulatory Visit: Payer: Self-pay | Admitting: Obstetrics and Gynecology

## 2017-08-23 DIAGNOSIS — Z01419 Encounter for gynecological examination (general) (routine) without abnormal findings: Secondary | ICD-10-CM | POA: Diagnosis not present

## 2017-08-24 LAB — CYTOLOGY - PAP: DIAGNOSIS: NEGATIVE

## 2018-07-15 IMAGING — US US MFM OB COMP +14 WKS
1 series · 14 of 28 positions shown · non-contrast
Comparison: none

[Series 1: us mfm ob comp +14 wks · 87 acquisitions, 14 frames shown]
[im 4/87]
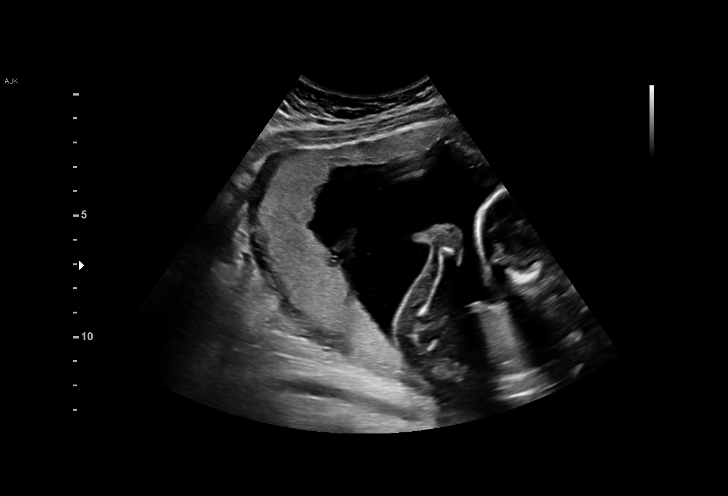
[im 10/87]
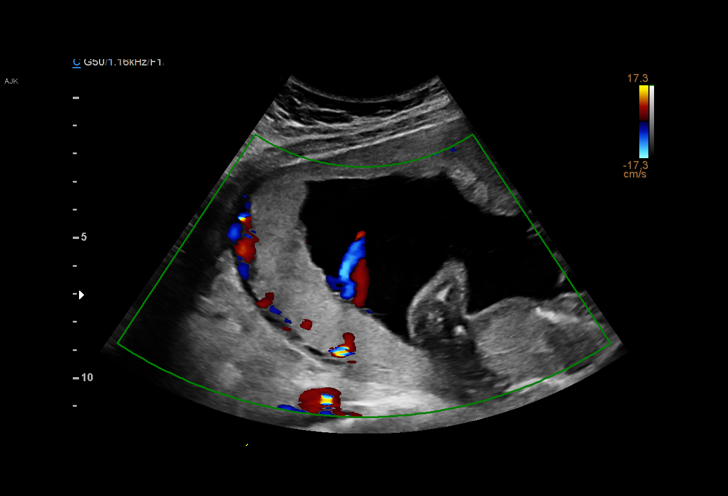
[im 16/87]
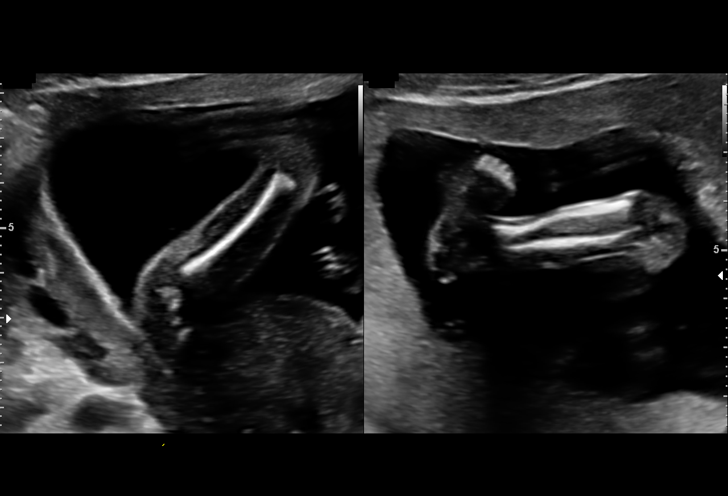
[im 23/87]
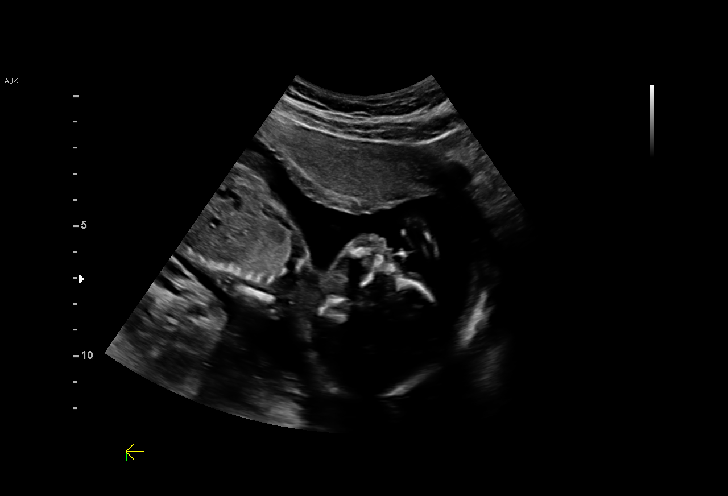
[im 29/87]
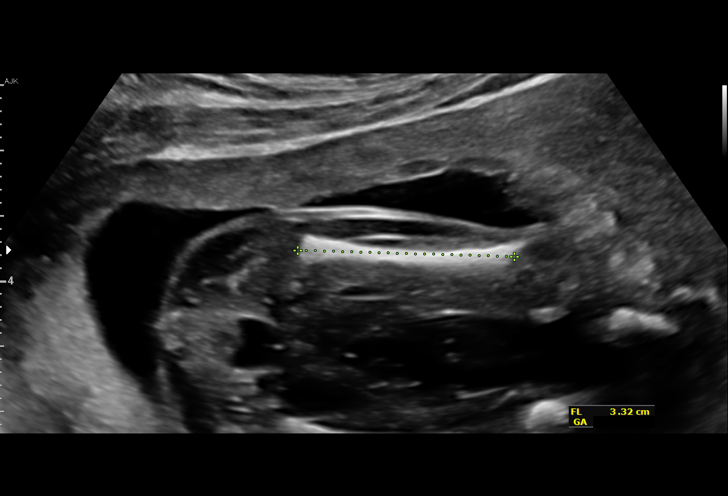
[im 36/87]
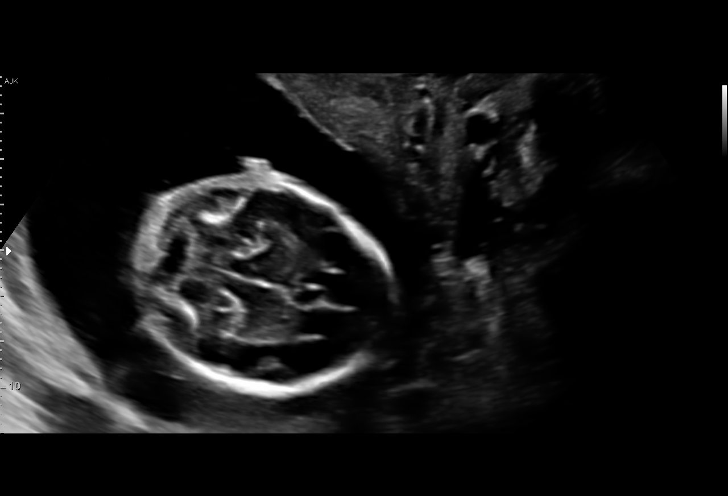
[im 42/87]
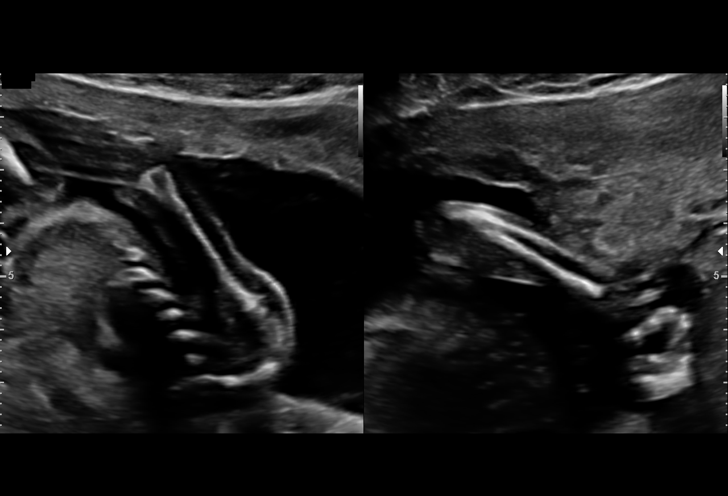
[im 48/87]
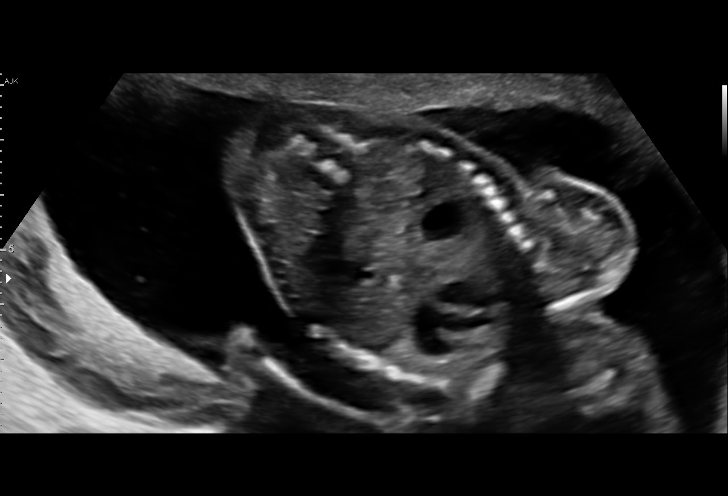
[im 55/87]
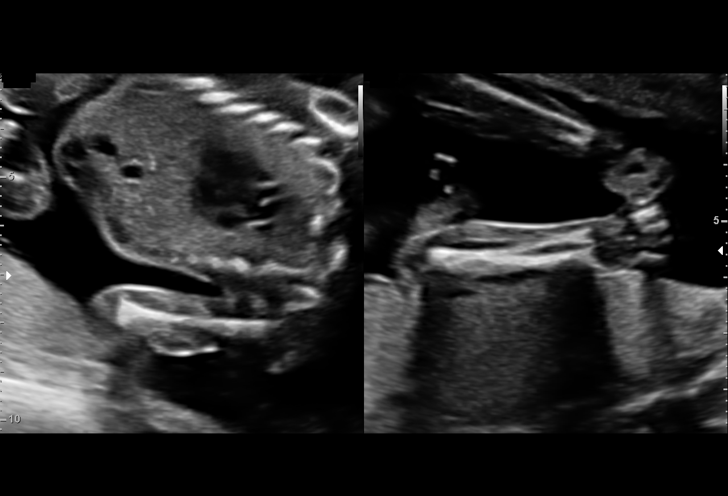
[im 61/87]
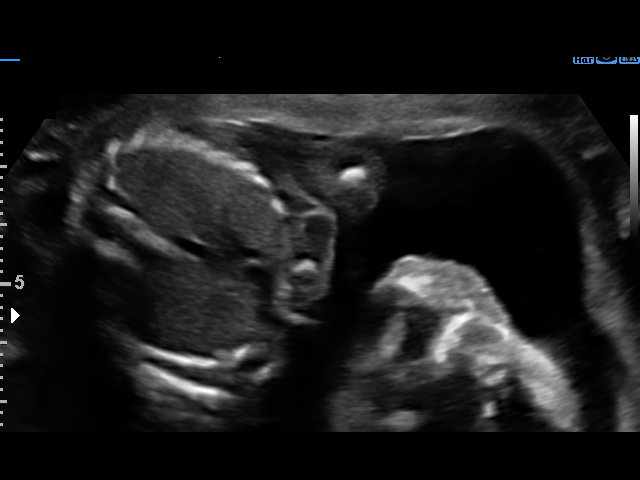
[im 67/87]
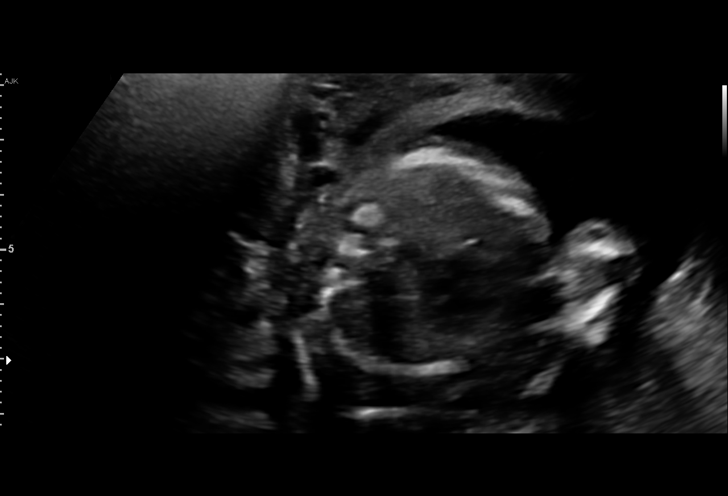
[im 74/87]
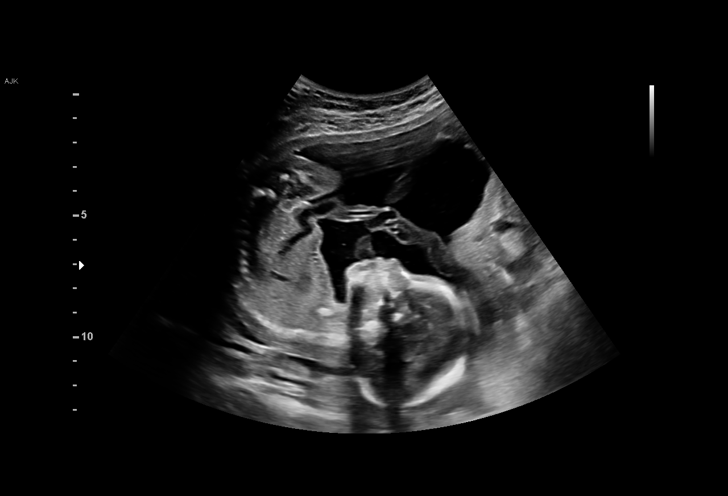
[im 80/87]
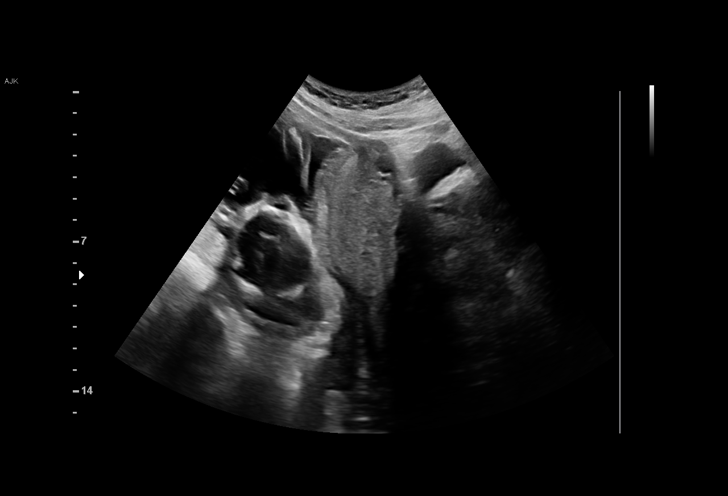
[im 87/87]
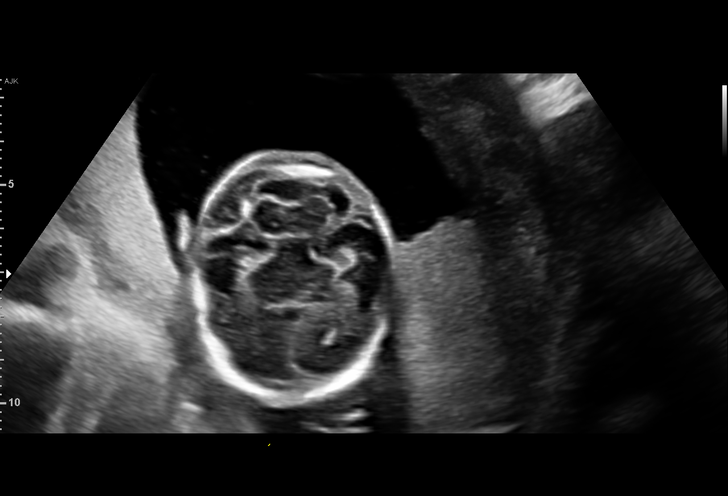

[14 of 28 positions shown; findings below may reference images not displayed]

[REDACTED]
Ave[address] [HOSPITAL]

1  KOLUTENDA SOTTER                [id number]      [id number]     [id number]
Indications

20 weeks gestation of pregnancy
Encounter for antenatal screening for
malformations
Poor obstetric history: Previous
preeclampsia / eclampsia/gestational HTN
OB History

Gravidity:    3         Term:   2        Prem:   0        SAB:   0
TOP:          0       Ectopic:  0        Living: 2
Fetal Evaluation

Num Of Fetuses:     1
Fetal Heart         146
Rate(bpm):
Cardiac Activity:   Observed
Presentation:       Cephalic
Placenta:           Posterior Fundal, above cervical os
P. Cord Insertion:  Visualized, central

Amniotic Fluid
AFI FV:      Subjectively within normal limits

Largest Pocket(cm)
5.62
Biometry

BPD:      46.6  mm     G. Age:  20w 1d         54  %    CI:        78.22   %    70 - 86
FL/HC:      20.6   %    16.8 -
HC:      166.7  mm     G. Age:  19w 3d         17  %    HC/AC:      1.11        1.09 -
AC:      149.8  mm     G. Age:  20w 1d         52  %    FL/BPD:     73.6   %
FL:       34.3  mm     G. Age:  20w 6d         70  %    FL/AC:      22.9   %    20 - 24
CER:      20.4  mm     G. Age:  19w 3d         37  %

Est. FW:     349  gm    0 lb 12 oz      54  %
Gestational Age

Clinical EDD:  20w 0d                                        EDD:   05/17/17
U/S Today:     20w 1d                                        EDD:   05/16/17
Best:          20w 0d     Det. By:  Clinical EDD             EDD:   05/17/17
Anatomy

Cranium:               Appears normal         Aortic Arch:            Appears normal
Cavum:                 Not well visualized    Ductal Arch:            Appears normal
Ventricles:            Appears normal         Diaphragm:              Appears normal
Choroid Plexus:        Appears normal         Stomach:                Appears normal, left
sided
Cerebellum:            Not well visualized    Abdomen:                Appears normal
Posterior Fossa:       Not well visualized    Abdominal Wall:         Appears nml (cord
insert, abd wall)
Nuchal Fold:           Not well visualized    Cord Vessels:           Appears normal (3
vessel cord)
Face:                  Orbits nl; profile not Kidneys:                Appear normal
well visualized
Lips:                  Appears normal         Bladder:                Appears normal
Thoracic:              Appears normal         Spine:                  Appears normal
Heart:                 Appears normal         Upper Extremities:      Appears normal
(4CH, axis, and situs
RVOT:                  Appears normal         Lower Extremities:      Appears normal
LVOT:                  Appears normal

Other:  Fetus appears to be a female. Heels visualized. Technically difficult
due to fetal position.
Cervix Uterus Adnexa

Cervix
Not adaquately visualized

Uterus
No abnormality visualized.

Left Ovary
Not visualized.

Right Ovary
Within normal limits.

Adnexa:       No abnormality visualized.
Impression

Single IUP at 20w 0d
Limited views of the cranial anatomy obtained
The remainder of the fetal anatomy appears normal
Posterior fundal placenta
Normal amniotic fluid volume
Recommendations

Recommend follow-up ultrasound examination in 4 weeks to
reevaluate the cranial anatomy
